# Patient Record
Sex: Female | Born: 1940
Health system: Southern US, Community
[De-identification: ages and names within clinical notes are randomized; demographics above are authoritative.]

## PROBLEM LIST (undated history)

## (undated) DIAGNOSIS — I10 Essential (primary) hypertension: Secondary | ICD-10-CM

## (undated) DIAGNOSIS — M810 Age-related osteoporosis without current pathological fracture: Secondary | ICD-10-CM

## (undated) DIAGNOSIS — M1990 Unspecified osteoarthritis, unspecified site: Secondary | ICD-10-CM

## (undated) DIAGNOSIS — E039 Hypothyroidism, unspecified: Secondary | ICD-10-CM

## (undated) HISTORY — DX: Unspecified osteoarthritis, unspecified site: M19.90

## (undated) HISTORY — PX: BLADDER SURGERY: SHX569

## (undated) HISTORY — DX: Age-related osteoporosis without current pathological fracture: M81.0

## (undated) HISTORY — DX: Essential (primary) hypertension: I10

## (undated) HISTORY — DX: Hypothyroidism, unspecified: E03.9

## (undated) HISTORY — PX: OTHER SURGICAL HISTORY: SHX169

## (undated) HISTORY — PX: COLON SURGERY: SHX602

---

## 1999-11-11 ENCOUNTER — Encounter: Payer: Self-pay | Admitting: Internal Medicine

## 1999-11-11 ENCOUNTER — Encounter: Admission: RE | Admit: 1999-11-11 | Discharge: 1999-11-11 | Payer: Self-pay | Admitting: Internal Medicine

## 2000-12-24 ENCOUNTER — Encounter: Payer: Self-pay | Admitting: Internal Medicine

## 2000-12-24 ENCOUNTER — Encounter: Admission: RE | Admit: 2000-12-24 | Discharge: 2000-12-24 | Payer: Self-pay | Admitting: Internal Medicine

## 2002-06-16 ENCOUNTER — Encounter: Payer: Self-pay | Admitting: Internal Medicine

## 2002-06-16 ENCOUNTER — Encounter: Admission: RE | Admit: 2002-06-16 | Discharge: 2002-06-16 | Payer: Self-pay | Admitting: Internal Medicine

## 2005-06-13 ENCOUNTER — Encounter: Admission: RE | Admit: 2005-06-13 | Discharge: 2005-06-13 | Payer: Self-pay | Admitting: Internal Medicine

## 2006-06-20 ENCOUNTER — Encounter: Admission: RE | Admit: 2006-06-20 | Discharge: 2006-06-20 | Payer: Self-pay | Admitting: Internal Medicine

## 2013-04-08 DIAGNOSIS — N8111 Cystocele, midline: Secondary | ICD-10-CM | POA: Diagnosis not present

## 2013-04-08 DIAGNOSIS — N814 Uterovaginal prolapse, unspecified: Secondary | ICD-10-CM | POA: Diagnosis not present

## 2013-04-30 DIAGNOSIS — N8189 Other female genital prolapse: Secondary | ICD-10-CM | POA: Diagnosis not present

## 2013-04-30 DIAGNOSIS — N814 Uterovaginal prolapse, unspecified: Secondary | ICD-10-CM | POA: Diagnosis not present

## 2013-04-30 DIAGNOSIS — I10 Essential (primary) hypertension: Secondary | ICD-10-CM | POA: Diagnosis not present

## 2013-04-30 DIAGNOSIS — D259 Leiomyoma of uterus, unspecified: Secondary | ICD-10-CM | POA: Diagnosis not present

## 2013-04-30 DIAGNOSIS — N8111 Cystocele, midline: Secondary | ICD-10-CM | POA: Diagnosis not present

## 2013-04-30 DIAGNOSIS — E039 Hypothyroidism, unspecified: Secondary | ICD-10-CM | POA: Diagnosis not present

## 2013-04-30 DIAGNOSIS — Z79899 Other long term (current) drug therapy: Secondary | ICD-10-CM | POA: Diagnosis not present

## 2013-05-01 DIAGNOSIS — Z79899 Other long term (current) drug therapy: Secondary | ICD-10-CM | POA: Diagnosis not present

## 2013-05-01 DIAGNOSIS — N8189 Other female genital prolapse: Secondary | ICD-10-CM | POA: Diagnosis not present

## 2013-05-01 DIAGNOSIS — D259 Leiomyoma of uterus, unspecified: Secondary | ICD-10-CM | POA: Diagnosis not present

## 2013-05-01 DIAGNOSIS — E039 Hypothyroidism, unspecified: Secondary | ICD-10-CM | POA: Diagnosis not present

## 2013-05-01 DIAGNOSIS — I10 Essential (primary) hypertension: Secondary | ICD-10-CM | POA: Diagnosis not present

## 2013-07-24 DIAGNOSIS — IMO0002 Reserved for concepts with insufficient information to code with codable children: Secondary | ICD-10-CM | POA: Diagnosis not present

## 2013-07-24 DIAGNOSIS — J019 Acute sinusitis, unspecified: Secondary | ICD-10-CM | POA: Diagnosis not present

## 2013-07-24 DIAGNOSIS — J309 Allergic rhinitis, unspecified: Secondary | ICD-10-CM | POA: Diagnosis not present

## 2013-10-22 DIAGNOSIS — E039 Hypothyroidism, unspecified: Secondary | ICD-10-CM | POA: Diagnosis not present

## 2013-10-22 DIAGNOSIS — I10 Essential (primary) hypertension: Secondary | ICD-10-CM | POA: Diagnosis not present

## 2013-10-22 DIAGNOSIS — IMO0002 Reserved for concepts with insufficient information to code with codable children: Secondary | ICD-10-CM | POA: Diagnosis not present

## 2013-10-22 DIAGNOSIS — M810 Age-related osteoporosis without current pathological fracture: Secondary | ICD-10-CM | POA: Diagnosis not present

## 2013-10-22 DIAGNOSIS — E559 Vitamin D deficiency, unspecified: Secondary | ICD-10-CM | POA: Diagnosis not present

## 2013-10-28 DIAGNOSIS — M810 Age-related osteoporosis without current pathological fracture: Secondary | ICD-10-CM | POA: Diagnosis not present

## 2014-01-13 DIAGNOSIS — N390 Urinary tract infection, site not specified: Secondary | ICD-10-CM | POA: Diagnosis not present

## 2014-01-13 DIAGNOSIS — I10 Essential (primary) hypertension: Secondary | ICD-10-CM | POA: Diagnosis not present

## 2014-04-28 DIAGNOSIS — M810 Age-related osteoporosis without current pathological fracture: Secondary | ICD-10-CM | POA: Diagnosis not present

## 2014-04-28 DIAGNOSIS — Z9181 History of falling: Secondary | ICD-10-CM | POA: Diagnosis not present

## 2014-04-28 DIAGNOSIS — Z1389 Encounter for screening for other disorder: Secondary | ICD-10-CM | POA: Diagnosis not present

## 2014-04-28 DIAGNOSIS — I10 Essential (primary) hypertension: Secondary | ICD-10-CM | POA: Diagnosis not present

## 2014-04-28 DIAGNOSIS — E785 Hyperlipidemia, unspecified: Secondary | ICD-10-CM | POA: Diagnosis not present

## 2014-04-28 DIAGNOSIS — Z23 Encounter for immunization: Secondary | ICD-10-CM | POA: Diagnosis not present

## 2014-04-28 DIAGNOSIS — E559 Vitamin D deficiency, unspecified: Secondary | ICD-10-CM | POA: Diagnosis not present

## 2014-04-28 DIAGNOSIS — Z1231 Encounter for screening mammogram for malignant neoplasm of breast: Secondary | ICD-10-CM | POA: Diagnosis not present

## 2014-04-28 DIAGNOSIS — M359 Systemic involvement of connective tissue, unspecified: Secondary | ICD-10-CM | POA: Diagnosis not present

## 2014-04-28 DIAGNOSIS — Z0000 Encounter for general adult medical examination without abnormal findings: Secondary | ICD-10-CM | POA: Diagnosis not present

## 2014-04-28 DIAGNOSIS — E039 Hypothyroidism, unspecified: Secondary | ICD-10-CM | POA: Diagnosis not present

## 2014-05-05 DIAGNOSIS — M810 Age-related osteoporosis without current pathological fracture: Secondary | ICD-10-CM | POA: Diagnosis not present

## 2014-05-05 DIAGNOSIS — Z1231 Encounter for screening mammogram for malignant neoplasm of breast: Secondary | ICD-10-CM | POA: Diagnosis not present

## 2014-05-19 DIAGNOSIS — M359 Systemic involvement of connective tissue, unspecified: Secondary | ICD-10-CM | POA: Diagnosis not present

## 2014-05-19 DIAGNOSIS — H524 Presbyopia: Secondary | ICD-10-CM | POA: Diagnosis not present

## 2014-05-19 DIAGNOSIS — Z79899 Other long term (current) drug therapy: Secondary | ICD-10-CM | POA: Diagnosis not present

## 2014-09-04 DIAGNOSIS — K209 Esophagitis, unspecified: Secondary | ICD-10-CM | POA: Diagnosis not present

## 2014-09-24 DIAGNOSIS — H16002 Unspecified corneal ulcer, left eye: Secondary | ICD-10-CM | POA: Diagnosis not present

## 2014-09-24 DIAGNOSIS — H169 Unspecified keratitis: Secondary | ICD-10-CM | POA: Diagnosis not present

## 2014-09-24 DIAGNOSIS — R918 Other nonspecific abnormal finding of lung field: Secondary | ICD-10-CM | POA: Diagnosis not present

## 2014-09-25 DIAGNOSIS — H16002 Unspecified corneal ulcer, left eye: Secondary | ICD-10-CM | POA: Diagnosis not present

## 2014-09-28 DIAGNOSIS — H16002 Unspecified corneal ulcer, left eye: Secondary | ICD-10-CM | POA: Diagnosis not present

## 2014-10-02 DIAGNOSIS — H16002 Unspecified corneal ulcer, left eye: Secondary | ICD-10-CM | POA: Diagnosis not present

## 2014-10-09 DIAGNOSIS — H16002 Unspecified corneal ulcer, left eye: Secondary | ICD-10-CM | POA: Diagnosis not present

## 2014-10-16 DIAGNOSIS — H16002 Unspecified corneal ulcer, left eye: Secondary | ICD-10-CM | POA: Diagnosis not present

## 2014-10-28 DIAGNOSIS — M810 Age-related osteoporosis without current pathological fracture: Secondary | ICD-10-CM | POA: Diagnosis not present

## 2014-10-28 DIAGNOSIS — M359 Systemic involvement of connective tissue, unspecified: Secondary | ICD-10-CM | POA: Diagnosis not present

## 2014-10-28 DIAGNOSIS — I10 Essential (primary) hypertension: Secondary | ICD-10-CM | POA: Diagnosis not present

## 2014-10-28 DIAGNOSIS — E785 Hyperlipidemia, unspecified: Secondary | ICD-10-CM | POA: Diagnosis not present

## 2014-10-28 DIAGNOSIS — Z1389 Encounter for screening for other disorder: Secondary | ICD-10-CM | POA: Diagnosis not present

## 2014-10-28 DIAGNOSIS — Z9181 History of falling: Secondary | ICD-10-CM | POA: Diagnosis not present

## 2014-11-04 DIAGNOSIS — M810 Age-related osteoporosis without current pathological fracture: Secondary | ICD-10-CM | POA: Diagnosis not present

## 2014-11-13 DIAGNOSIS — H16002 Unspecified corneal ulcer, left eye: Secondary | ICD-10-CM | POA: Diagnosis not present

## 2014-11-18 DIAGNOSIS — H179 Unspecified corneal scar and opacity: Secondary | ICD-10-CM | POA: Diagnosis not present

## 2014-11-18 DIAGNOSIS — H524 Presbyopia: Secondary | ICD-10-CM | POA: Diagnosis not present

## 2014-12-05 DIAGNOSIS — Z23 Encounter for immunization: Secondary | ICD-10-CM | POA: Diagnosis not present

## 2014-12-11 DIAGNOSIS — N3001 Acute cystitis with hematuria: Secondary | ICD-10-CM | POA: Diagnosis not present

## 2014-12-11 DIAGNOSIS — N3090 Cystitis, unspecified without hematuria: Secondary | ICD-10-CM | POA: Diagnosis not present

## 2015-02-20 DIAGNOSIS — J0100 Acute maxillary sinusitis, unspecified: Secondary | ICD-10-CM | POA: Diagnosis not present

## 2015-05-05 DIAGNOSIS — I10 Essential (primary) hypertension: Secondary | ICD-10-CM | POA: Diagnosis not present

## 2015-05-05 DIAGNOSIS — N390 Urinary tract infection, site not specified: Secondary | ICD-10-CM | POA: Diagnosis not present

## 2015-05-05 DIAGNOSIS — E785 Hyperlipidemia, unspecified: Secondary | ICD-10-CM | POA: Diagnosis not present

## 2015-05-05 DIAGNOSIS — E039 Hypothyroidism, unspecified: Secondary | ICD-10-CM | POA: Diagnosis not present

## 2015-05-05 DIAGNOSIS — E559 Vitamin D deficiency, unspecified: Secondary | ICD-10-CM | POA: Diagnosis not present

## 2015-05-05 DIAGNOSIS — M359 Systemic involvement of connective tissue, unspecified: Secondary | ICD-10-CM | POA: Diagnosis not present

## 2015-05-05 DIAGNOSIS — R300 Dysuria: Secondary | ICD-10-CM | POA: Diagnosis not present

## 2015-05-05 DIAGNOSIS — Z0000 Encounter for general adult medical examination without abnormal findings: Secondary | ICD-10-CM | POA: Diagnosis not present

## 2015-05-05 DIAGNOSIS — Z1231 Encounter for screening mammogram for malignant neoplasm of breast: Secondary | ICD-10-CM | POA: Diagnosis not present

## 2015-05-05 DIAGNOSIS — M810 Age-related osteoporosis without current pathological fracture: Secondary | ICD-10-CM | POA: Diagnosis not present

## 2015-05-19 DIAGNOSIS — M810 Age-related osteoporosis without current pathological fracture: Secondary | ICD-10-CM | POA: Diagnosis not present

## 2015-05-19 DIAGNOSIS — Z1231 Encounter for screening mammogram for malignant neoplasm of breast: Secondary | ICD-10-CM | POA: Diagnosis not present

## 2015-05-19 DIAGNOSIS — M8589 Other specified disorders of bone density and structure, multiple sites: Secondary | ICD-10-CM | POA: Diagnosis not present

## 2015-05-25 DIAGNOSIS — M810 Age-related osteoporosis without current pathological fracture: Secondary | ICD-10-CM | POA: Diagnosis not present

## 2015-06-04 DIAGNOSIS — H179 Unspecified corneal scar and opacity: Secondary | ICD-10-CM | POA: Diagnosis not present

## 2015-06-23 DIAGNOSIS — H02831 Dermatochalasis of right upper eyelid: Secondary | ICD-10-CM | POA: Diagnosis not present

## 2015-06-23 DIAGNOSIS — H02834 Dermatochalasis of left upper eyelid: Secondary | ICD-10-CM | POA: Diagnosis not present

## 2015-07-28 DIAGNOSIS — R601 Generalized edema: Secondary | ICD-10-CM | POA: Diagnosis not present

## 2015-07-28 DIAGNOSIS — M359 Systemic involvement of connective tissue, unspecified: Secondary | ICD-10-CM | POA: Diagnosis not present

## 2015-07-28 DIAGNOSIS — Z6826 Body mass index (BMI) 26.0-26.9, adult: Secondary | ICD-10-CM | POA: Diagnosis not present

## 2015-09-07 DIAGNOSIS — Z6826 Body mass index (BMI) 26.0-26.9, adult: Secondary | ICD-10-CM | POA: Diagnosis not present

## 2015-09-07 DIAGNOSIS — M359 Systemic involvement of connective tissue, unspecified: Secondary | ICD-10-CM | POA: Diagnosis not present

## 2015-09-07 DIAGNOSIS — E785 Hyperlipidemia, unspecified: Secondary | ICD-10-CM | POA: Diagnosis not present

## 2015-09-07 DIAGNOSIS — I10 Essential (primary) hypertension: Secondary | ICD-10-CM | POA: Diagnosis not present

## 2015-09-07 DIAGNOSIS — E039 Hypothyroidism, unspecified: Secondary | ICD-10-CM | POA: Diagnosis not present

## 2015-10-19 DIAGNOSIS — R609 Edema, unspecified: Secondary | ICD-10-CM | POA: Diagnosis not present

## 2015-10-19 DIAGNOSIS — I509 Heart failure, unspecified: Secondary | ICD-10-CM | POA: Diagnosis not present

## 2015-11-17 DIAGNOSIS — M810 Age-related osteoporosis without current pathological fracture: Secondary | ICD-10-CM | POA: Diagnosis not present

## 2015-12-08 DIAGNOSIS — M810 Age-related osteoporosis without current pathological fracture: Secondary | ICD-10-CM | POA: Diagnosis not present

## 2016-01-11 DIAGNOSIS — E785 Hyperlipidemia, unspecified: Secondary | ICD-10-CM | POA: Diagnosis not present

## 2016-01-11 DIAGNOSIS — M810 Age-related osteoporosis without current pathological fracture: Secondary | ICD-10-CM | POA: Diagnosis not present

## 2016-01-11 DIAGNOSIS — R601 Generalized edema: Secondary | ICD-10-CM | POA: Diagnosis not present

## 2016-01-11 DIAGNOSIS — Z9181 History of falling: Secondary | ICD-10-CM | POA: Diagnosis not present

## 2016-01-11 DIAGNOSIS — M359 Systemic involvement of connective tissue, unspecified: Secondary | ICD-10-CM | POA: Diagnosis not present

## 2016-01-11 DIAGNOSIS — I10 Essential (primary) hypertension: Secondary | ICD-10-CM | POA: Diagnosis not present

## 2016-01-11 DIAGNOSIS — Z1389 Encounter for screening for other disorder: Secondary | ICD-10-CM | POA: Diagnosis not present

## 2016-01-11 DIAGNOSIS — E559 Vitamin D deficiency, unspecified: Secondary | ICD-10-CM | POA: Diagnosis not present

## 2016-01-11 DIAGNOSIS — E039 Hypothyroidism, unspecified: Secondary | ICD-10-CM | POA: Diagnosis not present

## 2016-01-17 DIAGNOSIS — R300 Dysuria: Secondary | ICD-10-CM | POA: Diagnosis not present

## 2016-01-24 DIAGNOSIS — I2119 ST elevation (STEMI) myocardial infarction involving other coronary artery of inferior wall: Secondary | ICD-10-CM | POA: Diagnosis not present

## 2016-01-24 DIAGNOSIS — E039 Hypothyroidism, unspecified: Secondary | ICD-10-CM | POA: Diagnosis not present

## 2016-01-24 DIAGNOSIS — I10 Essential (primary) hypertension: Secondary | ICD-10-CM | POA: Diagnosis not present

## 2016-01-24 DIAGNOSIS — I2510 Atherosclerotic heart disease of native coronary artery without angina pectoris: Secondary | ICD-10-CM | POA: Diagnosis not present

## 2016-01-24 DIAGNOSIS — R079 Chest pain, unspecified: Secondary | ICD-10-CM | POA: Diagnosis not present

## 2016-01-24 DIAGNOSIS — Z01810 Encounter for preprocedural cardiovascular examination: Secondary | ICD-10-CM | POA: Diagnosis not present

## 2016-01-24 DIAGNOSIS — I25118 Atherosclerotic heart disease of native coronary artery with other forms of angina pectoris: Secondary | ICD-10-CM | POA: Diagnosis not present

## 2016-01-24 DIAGNOSIS — I6523 Occlusion and stenosis of bilateral carotid arteries: Secondary | ICD-10-CM | POA: Diagnosis not present

## 2016-01-24 DIAGNOSIS — I2111 ST elevation (STEMI) myocardial infarction involving right coronary artery: Secondary | ICD-10-CM | POA: Diagnosis not present

## 2016-01-25 DIAGNOSIS — I5021 Acute systolic (congestive) heart failure: Secondary | ICD-10-CM | POA: Diagnosis not present

## 2016-01-25 DIAGNOSIS — I2510 Atherosclerotic heart disease of native coronary artery without angina pectoris: Secondary | ICD-10-CM | POA: Diagnosis not present

## 2016-01-25 DIAGNOSIS — I2119 ST elevation (STEMI) myocardial infarction involving other coronary artery of inferior wall: Secondary | ICD-10-CM | POA: Diagnosis not present

## 2016-01-25 DIAGNOSIS — I2111 ST elevation (STEMI) myocardial infarction involving right coronary artery: Secondary | ICD-10-CM | POA: Diagnosis not present

## 2016-01-25 DIAGNOSIS — I25118 Atherosclerotic heart disease of native coronary artery with other forms of angina pectoris: Secondary | ICD-10-CM | POA: Diagnosis not present

## 2016-01-25 DIAGNOSIS — I213 ST elevation (STEMI) myocardial infarction of unspecified site: Secondary | ICD-10-CM | POA: Diagnosis not present

## 2016-01-26 DIAGNOSIS — I2111 ST elevation (STEMI) myocardial infarction involving right coronary artery: Secondary | ICD-10-CM | POA: Diagnosis not present

## 2016-01-26 DIAGNOSIS — I5021 Acute systolic (congestive) heart failure: Secondary | ICD-10-CM | POA: Diagnosis not present

## 2016-01-26 DIAGNOSIS — I2119 ST elevation (STEMI) myocardial infarction involving other coronary artery of inferior wall: Secondary | ICD-10-CM | POA: Diagnosis not present

## 2016-01-26 DIAGNOSIS — I25118 Atherosclerotic heart disease of native coronary artery with other forms of angina pectoris: Secondary | ICD-10-CM | POA: Diagnosis not present

## 2016-01-26 DIAGNOSIS — I252 Old myocardial infarction: Secondary | ICD-10-CM | POA: Diagnosis not present

## 2016-01-26 DIAGNOSIS — R9431 Abnormal electrocardiogram [ECG] [EKG]: Secondary | ICD-10-CM | POA: Diagnosis not present

## 2016-01-27 DIAGNOSIS — I2102 ST elevation (STEMI) myocardial infarction involving left anterior descending coronary artery: Secondary | ICD-10-CM | POA: Diagnosis not present

## 2016-01-27 DIAGNOSIS — Z955 Presence of coronary angioplasty implant and graft: Secondary | ICD-10-CM | POA: Diagnosis not present

## 2016-01-27 DIAGNOSIS — R9431 Abnormal electrocardiogram [ECG] [EKG]: Secondary | ICD-10-CM | POA: Diagnosis not present

## 2016-01-27 DIAGNOSIS — I25118 Atherosclerotic heart disease of native coronary artery with other forms of angina pectoris: Secondary | ICD-10-CM | POA: Diagnosis not present

## 2016-01-27 DIAGNOSIS — I119 Hypertensive heart disease without heart failure: Secondary | ICD-10-CM | POA: Diagnosis not present

## 2016-01-27 DIAGNOSIS — R001 Bradycardia, unspecified: Secondary | ICD-10-CM | POA: Diagnosis not present

## 2016-01-28 DIAGNOSIS — I25118 Atherosclerotic heart disease of native coronary artery with other forms of angina pectoris: Secondary | ICD-10-CM | POA: Diagnosis not present

## 2016-01-28 DIAGNOSIS — E039 Hypothyroidism, unspecified: Secondary | ICD-10-CM | POA: Diagnosis not present

## 2016-01-28 DIAGNOSIS — I119 Hypertensive heart disease without heart failure: Secondary | ICD-10-CM | POA: Diagnosis not present

## 2016-01-28 DIAGNOSIS — Z955 Presence of coronary angioplasty implant and graft: Secondary | ICD-10-CM | POA: Diagnosis not present

## 2016-01-28 DIAGNOSIS — I2102 ST elevation (STEMI) myocardial infarction involving left anterior descending coronary artery: Secondary | ICD-10-CM | POA: Diagnosis not present

## 2016-01-28 DIAGNOSIS — I252 Old myocardial infarction: Secondary | ICD-10-CM | POA: Diagnosis not present

## 2016-01-28 DIAGNOSIS — R9431 Abnormal electrocardiogram [ECG] [EKG]: Secondary | ICD-10-CM | POA: Diagnosis not present

## 2016-02-07 DIAGNOSIS — H02834 Dermatochalasis of left upper eyelid: Secondary | ICD-10-CM | POA: Diagnosis not present

## 2016-02-07 DIAGNOSIS — H02831 Dermatochalasis of right upper eyelid: Secondary | ICD-10-CM | POA: Diagnosis not present

## 2016-02-14 DIAGNOSIS — I5021 Acute systolic (congestive) heart failure: Secondary | ICD-10-CM | POA: Diagnosis not present

## 2016-02-14 DIAGNOSIS — I25119 Atherosclerotic heart disease of native coronary artery with unspecified angina pectoris: Secondary | ICD-10-CM | POA: Diagnosis not present

## 2016-02-14 DIAGNOSIS — I2119 ST elevation (STEMI) myocardial infarction involving other coronary artery of inferior wall: Secondary | ICD-10-CM | POA: Diagnosis not present

## 2016-02-29 DIAGNOSIS — Z955 Presence of coronary angioplasty implant and graft: Secondary | ICD-10-CM | POA: Diagnosis not present

## 2016-02-29 DIAGNOSIS — I252 Old myocardial infarction: Secondary | ICD-10-CM | POA: Diagnosis not present

## 2016-02-29 DIAGNOSIS — I5021 Acute systolic (congestive) heart failure: Secondary | ICD-10-CM | POA: Diagnosis not present

## 2016-03-03 DIAGNOSIS — Z955 Presence of coronary angioplasty implant and graft: Secondary | ICD-10-CM | POA: Diagnosis not present

## 2016-03-03 DIAGNOSIS — I252 Old myocardial infarction: Secondary | ICD-10-CM | POA: Diagnosis not present

## 2016-03-03 DIAGNOSIS — I5021 Acute systolic (congestive) heart failure: Secondary | ICD-10-CM | POA: Diagnosis not present

## 2016-03-07 DIAGNOSIS — I10 Essential (primary) hypertension: Secondary | ICD-10-CM | POA: Diagnosis not present

## 2016-03-07 DIAGNOSIS — I213 ST elevation (STEMI) myocardial infarction of unspecified site: Secondary | ICD-10-CM | POA: Diagnosis not present

## 2016-03-07 DIAGNOSIS — J309 Allergic rhinitis, unspecified: Secondary | ICD-10-CM | POA: Diagnosis not present

## 2016-03-21 DIAGNOSIS — J208 Acute bronchitis due to other specified organisms: Secondary | ICD-10-CM | POA: Diagnosis not present

## 2016-03-21 DIAGNOSIS — R233 Spontaneous ecchymoses: Secondary | ICD-10-CM | POA: Diagnosis not present

## 2016-03-21 DIAGNOSIS — R300 Dysuria: Secondary | ICD-10-CM | POA: Diagnosis not present

## 2016-03-21 DIAGNOSIS — N390 Urinary tract infection, site not specified: Secondary | ICD-10-CM | POA: Diagnosis not present

## 2016-03-29 DIAGNOSIS — R05 Cough: Secondary | ICD-10-CM | POA: Diagnosis not present

## 2016-03-29 DIAGNOSIS — R0602 Shortness of breath: Secondary | ICD-10-CM | POA: Diagnosis not present

## 2016-03-29 DIAGNOSIS — J208 Acute bronchitis due to other specified organisms: Secondary | ICD-10-CM | POA: Diagnosis not present

## 2016-03-29 DIAGNOSIS — R0600 Dyspnea, unspecified: Secondary | ICD-10-CM | POA: Diagnosis not present

## 2016-04-10 DIAGNOSIS — Z955 Presence of coronary angioplasty implant and graft: Secondary | ICD-10-CM | POA: Diagnosis not present

## 2016-04-10 DIAGNOSIS — I252 Old myocardial infarction: Secondary | ICD-10-CM | POA: Diagnosis not present

## 2016-04-18 DIAGNOSIS — H524 Presbyopia: Secondary | ICD-10-CM | POA: Diagnosis not present

## 2016-05-05 DIAGNOSIS — Z955 Presence of coronary angioplasty implant and graft: Secondary | ICD-10-CM | POA: Diagnosis not present

## 2016-05-05 DIAGNOSIS — I252 Old myocardial infarction: Secondary | ICD-10-CM | POA: Diagnosis not present

## 2016-05-05 DIAGNOSIS — I5021 Acute systolic (congestive) heart failure: Secondary | ICD-10-CM | POA: Diagnosis not present

## 2016-05-23 DIAGNOSIS — M359 Systemic involvement of connective tissue, unspecified: Secondary | ICD-10-CM | POA: Diagnosis not present

## 2016-05-23 DIAGNOSIS — I10 Essential (primary) hypertension: Secondary | ICD-10-CM | POA: Diagnosis not present

## 2016-05-23 DIAGNOSIS — Z6824 Body mass index (BMI) 24.0-24.9, adult: Secondary | ICD-10-CM | POA: Diagnosis not present

## 2016-05-23 DIAGNOSIS — I213 ST elevation (STEMI) myocardial infarction of unspecified site: Secondary | ICD-10-CM | POA: Diagnosis not present

## 2016-05-23 DIAGNOSIS — R233 Spontaneous ecchymoses: Secondary | ICD-10-CM | POA: Diagnosis not present

## 2016-05-23 DIAGNOSIS — E785 Hyperlipidemia, unspecified: Secondary | ICD-10-CM | POA: Diagnosis not present

## 2016-06-05 DIAGNOSIS — Z955 Presence of coronary angioplasty implant and graft: Secondary | ICD-10-CM | POA: Diagnosis not present

## 2016-06-05 DIAGNOSIS — I252 Old myocardial infarction: Secondary | ICD-10-CM | POA: Diagnosis not present

## 2016-06-05 DIAGNOSIS — I5021 Acute systolic (congestive) heart failure: Secondary | ICD-10-CM | POA: Diagnosis not present

## 2016-06-19 DIAGNOSIS — M810 Age-related osteoporosis without current pathological fracture: Secondary | ICD-10-CM | POA: Diagnosis not present

## 2016-08-08 DIAGNOSIS — Z1389 Encounter for screening for other disorder: Secondary | ICD-10-CM | POA: Diagnosis not present

## 2016-08-08 DIAGNOSIS — Z0000 Encounter for general adult medical examination without abnormal findings: Secondary | ICD-10-CM | POA: Diagnosis not present

## 2016-08-08 DIAGNOSIS — E785 Hyperlipidemia, unspecified: Secondary | ICD-10-CM | POA: Diagnosis not present

## 2016-08-08 DIAGNOSIS — Z136 Encounter for screening for cardiovascular disorders: Secondary | ICD-10-CM | POA: Diagnosis not present

## 2016-08-08 DIAGNOSIS — Z1211 Encounter for screening for malignant neoplasm of colon: Secondary | ICD-10-CM | POA: Diagnosis not present

## 2016-08-08 DIAGNOSIS — Z9181 History of falling: Secondary | ICD-10-CM | POA: Diagnosis not present

## 2016-08-14 DIAGNOSIS — I252 Old myocardial infarction: Secondary | ICD-10-CM | POA: Diagnosis not present

## 2016-08-14 DIAGNOSIS — I213 ST elevation (STEMI) myocardial infarction of unspecified site: Secondary | ICD-10-CM | POA: Diagnosis not present

## 2016-08-14 DIAGNOSIS — I25119 Atherosclerotic heart disease of native coronary artery with unspecified angina pectoris: Secondary | ICD-10-CM | POA: Diagnosis not present

## 2016-08-14 DIAGNOSIS — I5021 Acute systolic (congestive) heart failure: Secondary | ICD-10-CM | POA: Diagnosis not present

## 2016-08-24 DIAGNOSIS — N390 Urinary tract infection, site not specified: Secondary | ICD-10-CM | POA: Diagnosis not present

## 2016-09-19 DIAGNOSIS — E785 Hyperlipidemia, unspecified: Secondary | ICD-10-CM | POA: Diagnosis not present

## 2016-09-19 DIAGNOSIS — Z139 Encounter for screening, unspecified: Secondary | ICD-10-CM | POA: Diagnosis not present

## 2016-09-19 DIAGNOSIS — N3020 Other chronic cystitis without hematuria: Secondary | ICD-10-CM | POA: Diagnosis not present

## 2016-09-19 DIAGNOSIS — M359 Systemic involvement of connective tissue, unspecified: Secondary | ICD-10-CM | POA: Diagnosis not present

## 2016-09-19 DIAGNOSIS — I10 Essential (primary) hypertension: Secondary | ICD-10-CM | POA: Diagnosis not present

## 2016-09-19 DIAGNOSIS — E039 Hypothyroidism, unspecified: Secondary | ICD-10-CM | POA: Diagnosis not present

## 2016-09-19 DIAGNOSIS — Z6826 Body mass index (BMI) 26.0-26.9, adult: Secondary | ICD-10-CM | POA: Diagnosis not present

## 2016-09-20 DIAGNOSIS — I25119 Atherosclerotic heart disease of native coronary artery with unspecified angina pectoris: Secondary | ICD-10-CM | POA: Diagnosis not present

## 2016-09-20 DIAGNOSIS — I5021 Acute systolic (congestive) heart failure: Secondary | ICD-10-CM | POA: Diagnosis not present

## 2016-11-30 DIAGNOSIS — Z6827 Body mass index (BMI) 27.0-27.9, adult: Secondary | ICD-10-CM | POA: Diagnosis not present

## 2016-11-30 DIAGNOSIS — N3000 Acute cystitis without hematuria: Secondary | ICD-10-CM | POA: Diagnosis not present

## 2017-01-23 DIAGNOSIS — Z139 Encounter for screening, unspecified: Secondary | ICD-10-CM | POA: Diagnosis not present

## 2017-01-23 DIAGNOSIS — N3020 Other chronic cystitis without hematuria: Secondary | ICD-10-CM | POA: Diagnosis not present

## 2017-01-23 DIAGNOSIS — E785 Hyperlipidemia, unspecified: Secondary | ICD-10-CM | POA: Diagnosis not present

## 2017-01-23 DIAGNOSIS — Z1389 Encounter for screening for other disorder: Secondary | ICD-10-CM | POA: Diagnosis not present

## 2017-01-23 DIAGNOSIS — E039 Hypothyroidism, unspecified: Secondary | ICD-10-CM | POA: Diagnosis not present

## 2017-01-23 DIAGNOSIS — M810 Age-related osteoporosis without current pathological fracture: Secondary | ICD-10-CM | POA: Diagnosis not present

## 2017-01-23 DIAGNOSIS — I10 Essential (primary) hypertension: Secondary | ICD-10-CM | POA: Diagnosis not present

## 2017-02-01 DIAGNOSIS — M810 Age-related osteoporosis without current pathological fracture: Secondary | ICD-10-CM | POA: Diagnosis not present

## 2017-05-28 DIAGNOSIS — M810 Age-related osteoporosis without current pathological fracture: Secondary | ICD-10-CM | POA: Diagnosis not present

## 2017-05-28 DIAGNOSIS — I10 Essential (primary) hypertension: Secondary | ICD-10-CM | POA: Diagnosis not present

## 2017-05-28 DIAGNOSIS — E039 Hypothyroidism, unspecified: Secondary | ICD-10-CM | POA: Diagnosis not present

## 2017-05-28 DIAGNOSIS — E559 Vitamin D deficiency, unspecified: Secondary | ICD-10-CM | POA: Diagnosis not present

## 2017-05-28 DIAGNOSIS — I2510 Atherosclerotic heart disease of native coronary artery without angina pectoris: Secondary | ICD-10-CM | POA: Diagnosis not present

## 2017-05-28 DIAGNOSIS — E785 Hyperlipidemia, unspecified: Secondary | ICD-10-CM | POA: Diagnosis not present

## 2017-05-28 DIAGNOSIS — L659 Nonscarring hair loss, unspecified: Secondary | ICD-10-CM | POA: Diagnosis not present

## 2017-05-28 DIAGNOSIS — M359 Systemic involvement of connective tissue, unspecified: Secondary | ICD-10-CM | POA: Diagnosis not present

## 2017-05-28 DIAGNOSIS — Z6827 Body mass index (BMI) 27.0-27.9, adult: Secondary | ICD-10-CM | POA: Diagnosis not present

## 2017-06-26 DIAGNOSIS — H25813 Combined forms of age-related cataract, bilateral: Secondary | ICD-10-CM | POA: Diagnosis not present

## 2017-06-26 DIAGNOSIS — H02831 Dermatochalasis of right upper eyelid: Secondary | ICD-10-CM | POA: Diagnosis not present

## 2017-06-26 DIAGNOSIS — H02834 Dermatochalasis of left upper eyelid: Secondary | ICD-10-CM | POA: Diagnosis not present

## 2017-07-03 DIAGNOSIS — H02834 Dermatochalasis of left upper eyelid: Secondary | ICD-10-CM | POA: Diagnosis not present

## 2017-07-03 DIAGNOSIS — H02831 Dermatochalasis of right upper eyelid: Secondary | ICD-10-CM | POA: Diagnosis not present

## 2017-07-23 DIAGNOSIS — H02831 Dermatochalasis of right upper eyelid: Secondary | ICD-10-CM | POA: Diagnosis not present

## 2017-07-23 DIAGNOSIS — Z01818 Encounter for other preprocedural examination: Secondary | ICD-10-CM | POA: Diagnosis not present

## 2017-07-23 DIAGNOSIS — H02834 Dermatochalasis of left upper eyelid: Secondary | ICD-10-CM | POA: Diagnosis not present

## 2017-07-26 DIAGNOSIS — M359 Systemic involvement of connective tissue, unspecified: Secondary | ICD-10-CM | POA: Diagnosis not present

## 2017-07-26 DIAGNOSIS — Z6828 Body mass index (BMI) 28.0-28.9, adult: Secondary | ICD-10-CM | POA: Diagnosis not present

## 2017-07-26 DIAGNOSIS — L659 Nonscarring hair loss, unspecified: Secondary | ICD-10-CM | POA: Diagnosis not present

## 2017-07-26 DIAGNOSIS — E039 Hypothyroidism, unspecified: Secondary | ICD-10-CM | POA: Diagnosis not present

## 2017-07-26 DIAGNOSIS — N3020 Other chronic cystitis without hematuria: Secondary | ICD-10-CM | POA: Diagnosis not present

## 2017-07-26 DIAGNOSIS — R601 Generalized edema: Secondary | ICD-10-CM | POA: Diagnosis not present

## 2017-08-08 DIAGNOSIS — H02831 Dermatochalasis of right upper eyelid: Secondary | ICD-10-CM | POA: Diagnosis not present

## 2017-08-08 DIAGNOSIS — H02413 Mechanical ptosis of bilateral eyelids: Secondary | ICD-10-CM | POA: Diagnosis not present

## 2017-08-08 DIAGNOSIS — H02834 Dermatochalasis of left upper eyelid: Secondary | ICD-10-CM | POA: Diagnosis not present

## 2017-08-13 DIAGNOSIS — Z0000 Encounter for general adult medical examination without abnormal findings: Secondary | ICD-10-CM | POA: Diagnosis not present

## 2017-08-13 DIAGNOSIS — Z1231 Encounter for screening mammogram for malignant neoplasm of breast: Secondary | ICD-10-CM | POA: Diagnosis not present

## 2017-08-13 DIAGNOSIS — Z9181 History of falling: Secondary | ICD-10-CM | POA: Diagnosis not present

## 2017-08-13 DIAGNOSIS — N959 Unspecified menopausal and perimenopausal disorder: Secondary | ICD-10-CM | POA: Diagnosis not present

## 2017-08-13 DIAGNOSIS — Z1331 Encounter for screening for depression: Secondary | ICD-10-CM | POA: Diagnosis not present

## 2017-08-13 DIAGNOSIS — E785 Hyperlipidemia, unspecified: Secondary | ICD-10-CM | POA: Diagnosis not present

## 2017-08-13 DIAGNOSIS — Z136 Encounter for screening for cardiovascular disorders: Secondary | ICD-10-CM | POA: Diagnosis not present

## 2017-08-30 DIAGNOSIS — M810 Age-related osteoporosis without current pathological fracture: Secondary | ICD-10-CM | POA: Diagnosis not present

## 2017-08-31 DIAGNOSIS — R300 Dysuria: Secondary | ICD-10-CM | POA: Diagnosis not present

## 2017-08-31 DIAGNOSIS — N390 Urinary tract infection, site not specified: Secondary | ICD-10-CM | POA: Diagnosis not present

## 2017-08-31 DIAGNOSIS — N3020 Other chronic cystitis without hematuria: Secondary | ICD-10-CM | POA: Diagnosis not present

## 2017-09-25 DIAGNOSIS — R300 Dysuria: Secondary | ICD-10-CM | POA: Diagnosis not present

## 2017-09-25 DIAGNOSIS — I10 Essential (primary) hypertension: Secondary | ICD-10-CM | POA: Diagnosis not present

## 2017-09-25 DIAGNOSIS — M359 Systemic involvement of connective tissue, unspecified: Secondary | ICD-10-CM | POA: Diagnosis not present

## 2017-09-25 DIAGNOSIS — E785 Hyperlipidemia, unspecified: Secondary | ICD-10-CM | POA: Diagnosis not present

## 2017-09-25 DIAGNOSIS — I2510 Atherosclerotic heart disease of native coronary artery without angina pectoris: Secondary | ICD-10-CM | POA: Diagnosis not present

## 2017-09-25 DIAGNOSIS — Z79899 Other long term (current) drug therapy: Secondary | ICD-10-CM | POA: Diagnosis not present

## 2017-11-14 DIAGNOSIS — M8589 Other specified disorders of bone density and structure, multiple sites: Secondary | ICD-10-CM | POA: Diagnosis not present

## 2017-11-14 DIAGNOSIS — Z1231 Encounter for screening mammogram for malignant neoplasm of breast: Secondary | ICD-10-CM | POA: Diagnosis not present

## 2018-01-23 DIAGNOSIS — I25118 Atherosclerotic heart disease of native coronary artery with other forms of angina pectoris: Secondary | ICD-10-CM | POA: Diagnosis not present

## 2018-01-23 DIAGNOSIS — I252 Old myocardial infarction: Secondary | ICD-10-CM | POA: Diagnosis not present

## 2018-01-29 DIAGNOSIS — E663 Overweight: Secondary | ICD-10-CM | POA: Diagnosis not present

## 2018-01-29 DIAGNOSIS — M359 Systemic involvement of connective tissue, unspecified: Secondary | ICD-10-CM | POA: Diagnosis not present

## 2018-01-29 DIAGNOSIS — N3020 Other chronic cystitis without hematuria: Secondary | ICD-10-CM | POA: Diagnosis not present

## 2018-01-29 DIAGNOSIS — E785 Hyperlipidemia, unspecified: Secondary | ICD-10-CM | POA: Diagnosis not present

## 2018-01-29 DIAGNOSIS — Z79899 Other long term (current) drug therapy: Secondary | ICD-10-CM | POA: Diagnosis not present

## 2018-01-29 DIAGNOSIS — Z139 Encounter for screening, unspecified: Secondary | ICD-10-CM | POA: Diagnosis not present

## 2018-01-29 DIAGNOSIS — Z6827 Body mass index (BMI) 27.0-27.9, adult: Secondary | ICD-10-CM | POA: Diagnosis not present

## 2018-01-29 DIAGNOSIS — Z1339 Encounter for screening examination for other mental health and behavioral disorders: Secondary | ICD-10-CM | POA: Diagnosis not present

## 2018-01-29 DIAGNOSIS — I10 Essential (primary) hypertension: Secondary | ICD-10-CM | POA: Diagnosis not present

## 2018-02-19 DIAGNOSIS — E663 Overweight: Secondary | ICD-10-CM | POA: Diagnosis not present

## 2018-02-19 DIAGNOSIS — M79642 Pain in left hand: Secondary | ICD-10-CM | POA: Diagnosis not present

## 2018-02-19 DIAGNOSIS — Z6827 Body mass index (BMI) 27.0-27.9, adult: Secondary | ICD-10-CM | POA: Diagnosis not present

## 2018-02-19 DIAGNOSIS — M3500 Sicca syndrome, unspecified: Secondary | ICD-10-CM | POA: Diagnosis not present

## 2018-02-19 DIAGNOSIS — M65351 Trigger finger, right little finger: Secondary | ICD-10-CM | POA: Diagnosis not present

## 2018-02-19 DIAGNOSIS — M79641 Pain in right hand: Secondary | ICD-10-CM | POA: Diagnosis not present

## 2018-03-08 DIAGNOSIS — N390 Urinary tract infection, site not specified: Secondary | ICD-10-CM | POA: Diagnosis not present

## 2018-03-08 DIAGNOSIS — M810 Age-related osteoporosis without current pathological fracture: Secondary | ICD-10-CM | POA: Diagnosis not present

## 2018-03-14 DIAGNOSIS — M810 Age-related osteoporosis without current pathological fracture: Secondary | ICD-10-CM | POA: Diagnosis not present

## 2018-04-10 DIAGNOSIS — Z6828 Body mass index (BMI) 28.0-28.9, adult: Secondary | ICD-10-CM | POA: Diagnosis not present

## 2018-04-10 DIAGNOSIS — J310 Chronic rhinitis: Secondary | ICD-10-CM | POA: Diagnosis not present

## 2018-04-10 DIAGNOSIS — N390 Urinary tract infection, site not specified: Secondary | ICD-10-CM | POA: Diagnosis not present

## 2018-06-04 DIAGNOSIS — M8589 Other specified disorders of bone density and structure, multiple sites: Secondary | ICD-10-CM | POA: Diagnosis not present

## 2018-06-04 DIAGNOSIS — E785 Hyperlipidemia, unspecified: Secondary | ICD-10-CM | POA: Diagnosis not present

## 2018-06-04 DIAGNOSIS — M359 Systemic involvement of connective tissue, unspecified: Secondary | ICD-10-CM | POA: Diagnosis not present

## 2018-06-04 DIAGNOSIS — N3020 Other chronic cystitis without hematuria: Secondary | ICD-10-CM | POA: Diagnosis not present

## 2018-06-04 DIAGNOSIS — E039 Hypothyroidism, unspecified: Secondary | ICD-10-CM | POA: Diagnosis not present

## 2018-06-04 DIAGNOSIS — E559 Vitamin D deficiency, unspecified: Secondary | ICD-10-CM | POA: Diagnosis not present

## 2018-06-04 DIAGNOSIS — Z6828 Body mass index (BMI) 28.0-28.9, adult: Secondary | ICD-10-CM | POA: Diagnosis not present

## 2018-06-04 DIAGNOSIS — I10 Essential (primary) hypertension: Secondary | ICD-10-CM | POA: Diagnosis not present

## 2018-08-19 DIAGNOSIS — E785 Hyperlipidemia, unspecified: Secondary | ICD-10-CM | POA: Diagnosis not present

## 2018-08-19 DIAGNOSIS — Z1211 Encounter for screening for malignant neoplasm of colon: Secondary | ICD-10-CM | POA: Diagnosis not present

## 2018-08-19 DIAGNOSIS — Z1331 Encounter for screening for depression: Secondary | ICD-10-CM | POA: Diagnosis not present

## 2018-08-19 DIAGNOSIS — Z9181 History of falling: Secondary | ICD-10-CM | POA: Diagnosis not present

## 2018-08-19 DIAGNOSIS — Z1231 Encounter for screening mammogram for malignant neoplasm of breast: Secondary | ICD-10-CM | POA: Diagnosis not present

## 2018-08-19 DIAGNOSIS — Z0000 Encounter for general adult medical examination without abnormal findings: Secondary | ICD-10-CM | POA: Diagnosis not present

## 2018-09-04 DIAGNOSIS — M810 Age-related osteoporosis without current pathological fracture: Secondary | ICD-10-CM | POA: Diagnosis not present

## 2018-09-19 DIAGNOSIS — M810 Age-related osteoporosis without current pathological fracture: Secondary | ICD-10-CM | POA: Diagnosis not present

## 2018-10-15 DIAGNOSIS — E785 Hyperlipidemia, unspecified: Secondary | ICD-10-CM | POA: Diagnosis not present

## 2018-10-15 DIAGNOSIS — I10 Essential (primary) hypertension: Secondary | ICD-10-CM | POA: Diagnosis not present

## 2018-10-15 DIAGNOSIS — N3020 Other chronic cystitis without hematuria: Secondary | ICD-10-CM | POA: Diagnosis not present

## 2018-10-15 DIAGNOSIS — M359 Systemic involvement of connective tissue, unspecified: Secondary | ICD-10-CM | POA: Diagnosis not present

## 2018-10-15 DIAGNOSIS — Z6827 Body mass index (BMI) 27.0-27.9, adult: Secondary | ICD-10-CM | POA: Diagnosis not present

## 2018-10-15 DIAGNOSIS — M8589 Other specified disorders of bone density and structure, multiple sites: Secondary | ICD-10-CM | POA: Diagnosis not present

## 2018-10-15 DIAGNOSIS — E559 Vitamin D deficiency, unspecified: Secondary | ICD-10-CM | POA: Diagnosis not present

## 2018-10-15 DIAGNOSIS — M810 Age-related osteoporosis without current pathological fracture: Secondary | ICD-10-CM | POA: Diagnosis not present

## 2018-10-15 DIAGNOSIS — E039 Hypothyroidism, unspecified: Secondary | ICD-10-CM | POA: Diagnosis not present

## 2018-10-15 DIAGNOSIS — E663 Overweight: Secondary | ICD-10-CM | POA: Diagnosis not present

## 2018-10-15 DIAGNOSIS — Z79899 Other long term (current) drug therapy: Secondary | ICD-10-CM | POA: Diagnosis not present

## 2018-11-18 DIAGNOSIS — Z1231 Encounter for screening mammogram for malignant neoplasm of breast: Secondary | ICD-10-CM | POA: Diagnosis not present

## 2018-11-28 DIAGNOSIS — M359 Systemic involvement of connective tissue, unspecified: Secondary | ICD-10-CM | POA: Diagnosis not present

## 2018-11-28 DIAGNOSIS — E039 Hypothyroidism, unspecified: Secondary | ICD-10-CM | POA: Diagnosis not present

## 2018-11-28 DIAGNOSIS — L659 Nonscarring hair loss, unspecified: Secondary | ICD-10-CM | POA: Diagnosis not present

## 2018-11-28 DIAGNOSIS — Z6828 Body mass index (BMI) 28.0-28.9, adult: Secondary | ICD-10-CM | POA: Diagnosis not present

## 2018-11-28 DIAGNOSIS — R609 Edema, unspecified: Secondary | ICD-10-CM | POA: Diagnosis not present

## 2018-12-06 DIAGNOSIS — R609 Edema, unspecified: Secondary | ICD-10-CM | POA: Diagnosis not present

## 2018-12-06 DIAGNOSIS — E039 Hypothyroidism, unspecified: Secondary | ICD-10-CM | POA: Diagnosis not present

## 2018-12-06 DIAGNOSIS — Z6827 Body mass index (BMI) 27.0-27.9, adult: Secondary | ICD-10-CM | POA: Diagnosis not present

## 2018-12-16 DIAGNOSIS — E7849 Other hyperlipidemia: Secondary | ICD-10-CM | POA: Diagnosis not present

## 2018-12-16 DIAGNOSIS — I2510 Atherosclerotic heart disease of native coronary artery without angina pectoris: Secondary | ICD-10-CM | POA: Diagnosis not present

## 2018-12-16 DIAGNOSIS — I5021 Acute systolic (congestive) heart failure: Secondary | ICD-10-CM | POA: Diagnosis not present

## 2018-12-16 DIAGNOSIS — I252 Old myocardial infarction: Secondary | ICD-10-CM | POA: Diagnosis not present

## 2019-01-03 DIAGNOSIS — R109 Unspecified abdominal pain: Secondary | ICD-10-CM | POA: Diagnosis not present

## 2019-01-03 DIAGNOSIS — Z6828 Body mass index (BMI) 28.0-28.9, adult: Secondary | ICD-10-CM | POA: Diagnosis not present

## 2019-01-03 DIAGNOSIS — R609 Edema, unspecified: Secondary | ICD-10-CM | POA: Diagnosis not present

## 2019-01-03 DIAGNOSIS — M359 Systemic involvement of connective tissue, unspecified: Secondary | ICD-10-CM | POA: Diagnosis not present

## 2019-01-03 DIAGNOSIS — I2510 Atherosclerotic heart disease of native coronary artery without angina pectoris: Secondary | ICD-10-CM | POA: Diagnosis not present

## 2019-01-08 DIAGNOSIS — I2510 Atherosclerotic heart disease of native coronary artery without angina pectoris: Secondary | ICD-10-CM | POA: Diagnosis not present

## 2019-01-17 DIAGNOSIS — E039 Hypothyroidism, unspecified: Secondary | ICD-10-CM | POA: Diagnosis not present

## 2019-01-17 DIAGNOSIS — Z6828 Body mass index (BMI) 28.0-28.9, adult: Secondary | ICD-10-CM | POA: Diagnosis not present

## 2019-01-17 DIAGNOSIS — L609 Nail disorder, unspecified: Secondary | ICD-10-CM | POA: Diagnosis not present

## 2019-01-17 DIAGNOSIS — R609 Edema, unspecified: Secondary | ICD-10-CM | POA: Diagnosis not present

## 2019-02-18 DIAGNOSIS — I10 Essential (primary) hypertension: Secondary | ICD-10-CM | POA: Diagnosis not present

## 2019-02-18 DIAGNOSIS — M8589 Other specified disorders of bone density and structure, multiple sites: Secondary | ICD-10-CM | POA: Diagnosis not present

## 2019-02-18 DIAGNOSIS — E785 Hyperlipidemia, unspecified: Secondary | ICD-10-CM | POA: Diagnosis not present

## 2019-02-18 DIAGNOSIS — E559 Vitamin D deficiency, unspecified: Secondary | ICD-10-CM | POA: Diagnosis not present

## 2019-02-18 DIAGNOSIS — Z79899 Other long term (current) drug therapy: Secondary | ICD-10-CM | POA: Diagnosis not present

## 2019-02-18 DIAGNOSIS — Z6828 Body mass index (BMI) 28.0-28.9, adult: Secondary | ICD-10-CM | POA: Diagnosis not present

## 2019-02-18 DIAGNOSIS — M810 Age-related osteoporosis without current pathological fracture: Secondary | ICD-10-CM | POA: Diagnosis not present

## 2019-02-18 DIAGNOSIS — E039 Hypothyroidism, unspecified: Secondary | ICD-10-CM | POA: Diagnosis not present

## 2019-03-13 DIAGNOSIS — M810 Age-related osteoporosis without current pathological fracture: Secondary | ICD-10-CM | POA: Diagnosis not present

## 2019-04-18 DIAGNOSIS — M810 Age-related osteoporosis without current pathological fracture: Secondary | ICD-10-CM | POA: Diagnosis not present

## 2019-04-18 DIAGNOSIS — N390 Urinary tract infection, site not specified: Secondary | ICD-10-CM | POA: Diagnosis not present

## 2019-04-25 DIAGNOSIS — H25812 Combined forms of age-related cataract, left eye: Secondary | ICD-10-CM | POA: Diagnosis not present

## 2019-04-25 DIAGNOSIS — H25811 Combined forms of age-related cataract, right eye: Secondary | ICD-10-CM | POA: Diagnosis not present

## 2019-04-25 DIAGNOSIS — Z01818 Encounter for other preprocedural examination: Secondary | ICD-10-CM | POA: Diagnosis not present

## 2019-05-16 DIAGNOSIS — H25812 Combined forms of age-related cataract, left eye: Secondary | ICD-10-CM | POA: Diagnosis not present

## 2019-05-16 DIAGNOSIS — H2512 Age-related nuclear cataract, left eye: Secondary | ICD-10-CM | POA: Diagnosis not present

## 2019-05-29 DIAGNOSIS — H2511 Age-related nuclear cataract, right eye: Secondary | ICD-10-CM | POA: Diagnosis not present

## 2019-05-29 DIAGNOSIS — H25811 Combined forms of age-related cataract, right eye: Secondary | ICD-10-CM | POA: Diagnosis not present

## 2019-06-13 DIAGNOSIS — M359 Systemic involvement of connective tissue, unspecified: Secondary | ICD-10-CM | POA: Diagnosis not present

## 2019-06-13 DIAGNOSIS — R609 Edema, unspecified: Secondary | ICD-10-CM | POA: Diagnosis not present

## 2019-06-13 DIAGNOSIS — Z6828 Body mass index (BMI) 28.0-28.9, adult: Secondary | ICD-10-CM | POA: Diagnosis not present

## 2019-06-20 DIAGNOSIS — E785 Hyperlipidemia, unspecified: Secondary | ICD-10-CM | POA: Diagnosis not present

## 2019-06-20 DIAGNOSIS — L659 Nonscarring hair loss, unspecified: Secondary | ICD-10-CM | POA: Diagnosis not present

## 2019-06-20 DIAGNOSIS — R609 Edema, unspecified: Secondary | ICD-10-CM | POA: Diagnosis not present

## 2019-06-20 DIAGNOSIS — Z79899 Other long term (current) drug therapy: Secondary | ICD-10-CM | POA: Diagnosis not present

## 2019-06-20 DIAGNOSIS — E559 Vitamin D deficiency, unspecified: Secondary | ICD-10-CM | POA: Diagnosis not present

## 2019-06-20 DIAGNOSIS — R300 Dysuria: Secondary | ICD-10-CM | POA: Diagnosis not present

## 2019-06-20 DIAGNOSIS — Z6827 Body mass index (BMI) 27.0-27.9, adult: Secondary | ICD-10-CM | POA: Diagnosis not present

## 2019-06-20 DIAGNOSIS — Z139 Encounter for screening, unspecified: Secondary | ICD-10-CM | POA: Diagnosis not present

## 2019-06-20 DIAGNOSIS — M8589 Other specified disorders of bone density and structure, multiple sites: Secondary | ICD-10-CM | POA: Diagnosis not present

## 2019-06-20 DIAGNOSIS — M810 Age-related osteoporosis without current pathological fracture: Secondary | ICD-10-CM | POA: Diagnosis not present

## 2019-06-20 DIAGNOSIS — E039 Hypothyroidism, unspecified: Secondary | ICD-10-CM | POA: Diagnosis not present

## 2019-06-20 DIAGNOSIS — I10 Essential (primary) hypertension: Secondary | ICD-10-CM | POA: Diagnosis not present

## 2019-07-09 DIAGNOSIS — K5792 Diverticulitis of intestine, part unspecified, without perforation or abscess without bleeding: Secondary | ICD-10-CM | POA: Diagnosis not present

## 2019-08-25 DIAGNOSIS — Z1331 Encounter for screening for depression: Secondary | ICD-10-CM | POA: Diagnosis not present

## 2019-08-25 DIAGNOSIS — E785 Hyperlipidemia, unspecified: Secondary | ICD-10-CM | POA: Diagnosis not present

## 2019-08-25 DIAGNOSIS — Z9181 History of falling: Secondary | ICD-10-CM | POA: Diagnosis not present

## 2019-08-25 DIAGNOSIS — Z0000 Encounter for general adult medical examination without abnormal findings: Secondary | ICD-10-CM | POA: Diagnosis not present

## 2019-09-30 DIAGNOSIS — E785 Hyperlipidemia, unspecified: Secondary | ICD-10-CM | POA: Diagnosis not present

## 2019-09-30 DIAGNOSIS — I10 Essential (primary) hypertension: Secondary | ICD-10-CM | POA: Diagnosis not present

## 2019-10-28 DIAGNOSIS — E559 Vitamin D deficiency, unspecified: Secondary | ICD-10-CM | POA: Diagnosis not present

## 2019-10-28 DIAGNOSIS — M359 Systemic involvement of connective tissue, unspecified: Secondary | ICD-10-CM | POA: Diagnosis not present

## 2019-10-28 DIAGNOSIS — R609 Edema, unspecified: Secondary | ICD-10-CM | POA: Diagnosis not present

## 2019-10-28 DIAGNOSIS — M8589 Other specified disorders of bone density and structure, multiple sites: Secondary | ICD-10-CM | POA: Diagnosis not present

## 2019-10-28 DIAGNOSIS — I10 Essential (primary) hypertension: Secondary | ICD-10-CM | POA: Diagnosis not present

## 2019-10-28 DIAGNOSIS — E039 Hypothyroidism, unspecified: Secondary | ICD-10-CM | POA: Diagnosis not present

## 2019-10-28 DIAGNOSIS — L659 Nonscarring hair loss, unspecified: Secondary | ICD-10-CM | POA: Diagnosis not present

## 2019-10-28 DIAGNOSIS — M810 Age-related osteoporosis without current pathological fracture: Secondary | ICD-10-CM | POA: Diagnosis not present

## 2019-10-28 DIAGNOSIS — E785 Hyperlipidemia, unspecified: Secondary | ICD-10-CM | POA: Diagnosis not present

## 2019-10-28 DIAGNOSIS — Z79899 Other long term (current) drug therapy: Secondary | ICD-10-CM | POA: Diagnosis not present

## 2019-10-28 DIAGNOSIS — Z6827 Body mass index (BMI) 27.0-27.9, adult: Secondary | ICD-10-CM | POA: Diagnosis not present

## 2019-11-19 DIAGNOSIS — N959 Unspecified menopausal and perimenopausal disorder: Secondary | ICD-10-CM | POA: Diagnosis not present

## 2019-11-19 DIAGNOSIS — M8589 Other specified disorders of bone density and structure, multiple sites: Secondary | ICD-10-CM | POA: Diagnosis not present

## 2019-11-19 DIAGNOSIS — Z1231 Encounter for screening mammogram for malignant neoplasm of breast: Secondary | ICD-10-CM | POA: Diagnosis not present

## 2020-01-05 DIAGNOSIS — I2510 Atherosclerotic heart disease of native coronary artery without angina pectoris: Secondary | ICD-10-CM | POA: Diagnosis not present

## 2020-01-05 DIAGNOSIS — I252 Old myocardial infarction: Secondary | ICD-10-CM | POA: Diagnosis not present

## 2020-01-05 DIAGNOSIS — E785 Hyperlipidemia, unspecified: Secondary | ICD-10-CM | POA: Diagnosis not present

## 2020-01-05 DIAGNOSIS — R600 Localized edema: Secondary | ICD-10-CM | POA: Diagnosis not present

## 2020-03-02 DIAGNOSIS — I10 Essential (primary) hypertension: Secondary | ICD-10-CM | POA: Diagnosis not present

## 2020-03-02 DIAGNOSIS — E039 Hypothyroidism, unspecified: Secondary | ICD-10-CM | POA: Diagnosis not present

## 2020-03-02 DIAGNOSIS — R609 Edema, unspecified: Secondary | ICD-10-CM | POA: Diagnosis not present

## 2020-03-02 DIAGNOSIS — E559 Vitamin D deficiency, unspecified: Secondary | ICD-10-CM | POA: Diagnosis not present

## 2020-03-02 DIAGNOSIS — Z79899 Other long term (current) drug therapy: Secondary | ICD-10-CM | POA: Diagnosis not present

## 2020-03-02 DIAGNOSIS — M25561 Pain in right knee: Secondary | ICD-10-CM | POA: Diagnosis not present

## 2020-03-02 DIAGNOSIS — Z6827 Body mass index (BMI) 27.0-27.9, adult: Secondary | ICD-10-CM | POA: Diagnosis not present

## 2020-03-02 DIAGNOSIS — M359 Systemic involvement of connective tissue, unspecified: Secondary | ICD-10-CM | POA: Diagnosis not present

## 2020-03-02 DIAGNOSIS — L659 Nonscarring hair loss, unspecified: Secondary | ICD-10-CM | POA: Diagnosis not present

## 2020-03-02 DIAGNOSIS — M8589 Other specified disorders of bone density and structure, multiple sites: Secondary | ICD-10-CM | POA: Diagnosis not present

## 2020-03-02 DIAGNOSIS — E785 Hyperlipidemia, unspecified: Secondary | ICD-10-CM | POA: Diagnosis not present

## 2020-05-21 DIAGNOSIS — M810 Age-related osteoporosis without current pathological fracture: Secondary | ICD-10-CM | POA: Diagnosis not present

## 2020-07-06 DIAGNOSIS — M810 Age-related osteoporosis without current pathological fracture: Secondary | ICD-10-CM | POA: Diagnosis not present

## 2020-07-06 DIAGNOSIS — L659 Nonscarring hair loss, unspecified: Secondary | ICD-10-CM | POA: Diagnosis not present

## 2020-07-06 DIAGNOSIS — M359 Systemic involvement of connective tissue, unspecified: Secondary | ICD-10-CM | POA: Diagnosis not present

## 2020-07-06 DIAGNOSIS — Z6827 Body mass index (BMI) 27.0-27.9, adult: Secondary | ICD-10-CM | POA: Diagnosis not present

## 2020-07-06 DIAGNOSIS — E039 Hypothyroidism, unspecified: Secondary | ICD-10-CM | POA: Diagnosis not present

## 2020-07-06 DIAGNOSIS — R609 Edema, unspecified: Secondary | ICD-10-CM | POA: Diagnosis not present

## 2020-07-06 DIAGNOSIS — E785 Hyperlipidemia, unspecified: Secondary | ICD-10-CM | POA: Diagnosis not present

## 2020-07-06 DIAGNOSIS — M8589 Other specified disorders of bone density and structure, multiple sites: Secondary | ICD-10-CM | POA: Diagnosis not present

## 2020-07-06 DIAGNOSIS — I10 Essential (primary) hypertension: Secondary | ICD-10-CM | POA: Diagnosis not present

## 2020-07-06 DIAGNOSIS — E559 Vitamin D deficiency, unspecified: Secondary | ICD-10-CM | POA: Diagnosis not present

## 2020-07-06 DIAGNOSIS — Z79899 Other long term (current) drug therapy: Secondary | ICD-10-CM | POA: Diagnosis not present

## 2020-08-03 DIAGNOSIS — H524 Presbyopia: Secondary | ICD-10-CM | POA: Diagnosis not present

## 2020-09-13 DIAGNOSIS — Z0000 Encounter for general adult medical examination without abnormal findings: Secondary | ICD-10-CM | POA: Diagnosis not present

## 2020-09-13 DIAGNOSIS — Z9181 History of falling: Secondary | ICD-10-CM | POA: Diagnosis not present

## 2020-09-13 DIAGNOSIS — Z1331 Encounter for screening for depression: Secondary | ICD-10-CM | POA: Diagnosis not present

## 2020-09-13 DIAGNOSIS — Z139 Encounter for screening, unspecified: Secondary | ICD-10-CM | POA: Diagnosis not present

## 2020-09-13 DIAGNOSIS — E785 Hyperlipidemia, unspecified: Secondary | ICD-10-CM | POA: Diagnosis not present

## 2020-09-30 DIAGNOSIS — Z79899 Other long term (current) drug therapy: Secondary | ICD-10-CM | POA: Diagnosis not present

## 2020-10-14 DIAGNOSIS — R609 Edema, unspecified: Secondary | ICD-10-CM | POA: Diagnosis not present

## 2020-10-14 DIAGNOSIS — M25561 Pain in right knee: Secondary | ICD-10-CM | POA: Diagnosis not present

## 2020-10-14 DIAGNOSIS — Z6827 Body mass index (BMI) 27.0-27.9, adult: Secondary | ICD-10-CM | POA: Diagnosis not present

## 2020-10-14 DIAGNOSIS — M359 Systemic involvement of connective tissue, unspecified: Secondary | ICD-10-CM | POA: Diagnosis not present

## 2020-11-25 DIAGNOSIS — L659 Nonscarring hair loss, unspecified: Secondary | ICD-10-CM | POA: Diagnosis not present

## 2020-11-25 DIAGNOSIS — R609 Edema, unspecified: Secondary | ICD-10-CM | POA: Diagnosis not present

## 2020-11-25 DIAGNOSIS — E559 Vitamin D deficiency, unspecified: Secondary | ICD-10-CM | POA: Diagnosis not present

## 2020-11-25 DIAGNOSIS — M359 Systemic involvement of connective tissue, unspecified: Secondary | ICD-10-CM | POA: Diagnosis not present

## 2020-11-25 DIAGNOSIS — I2510 Atherosclerotic heart disease of native coronary artery without angina pectoris: Secondary | ICD-10-CM | POA: Diagnosis not present

## 2020-11-25 DIAGNOSIS — E039 Hypothyroidism, unspecified: Secondary | ICD-10-CM | POA: Diagnosis not present

## 2020-11-25 DIAGNOSIS — E785 Hyperlipidemia, unspecified: Secondary | ICD-10-CM | POA: Diagnosis not present

## 2020-11-25 DIAGNOSIS — M810 Age-related osteoporosis without current pathological fracture: Secondary | ICD-10-CM | POA: Diagnosis not present

## 2020-11-25 DIAGNOSIS — I10 Essential (primary) hypertension: Secondary | ICD-10-CM | POA: Diagnosis not present

## 2020-11-25 DIAGNOSIS — Z79899 Other long term (current) drug therapy: Secondary | ICD-10-CM | POA: Diagnosis not present

## 2020-11-25 DIAGNOSIS — Z6827 Body mass index (BMI) 27.0-27.9, adult: Secondary | ICD-10-CM | POA: Diagnosis not present

## 2020-11-26 DIAGNOSIS — Z1231 Encounter for screening mammogram for malignant neoplasm of breast: Secondary | ICD-10-CM | POA: Diagnosis not present

## 2020-12-29 DIAGNOSIS — E7849 Other hyperlipidemia: Secondary | ICD-10-CM | POA: Diagnosis not present

## 2020-12-29 DIAGNOSIS — I252 Old myocardial infarction: Secondary | ICD-10-CM | POA: Diagnosis not present

## 2020-12-29 DIAGNOSIS — I25118 Atherosclerotic heart disease of native coronary artery with other forms of angina pectoris: Secondary | ICD-10-CM | POA: Diagnosis not present

## 2020-12-29 DIAGNOSIS — M7989 Other specified soft tissue disorders: Secondary | ICD-10-CM | POA: Diagnosis not present

## 2020-12-30 DIAGNOSIS — I4510 Unspecified right bundle-branch block: Secondary | ICD-10-CM | POA: Diagnosis not present

## 2020-12-30 DIAGNOSIS — I2510 Atherosclerotic heart disease of native coronary artery without angina pectoris: Secondary | ICD-10-CM | POA: Diagnosis not present

## 2021-01-14 DIAGNOSIS — R300 Dysuria: Secondary | ICD-10-CM | POA: Diagnosis not present

## 2021-01-14 DIAGNOSIS — R609 Edema, unspecified: Secondary | ICD-10-CM | POA: Diagnosis not present

## 2021-01-14 DIAGNOSIS — M810 Age-related osteoporosis without current pathological fracture: Secondary | ICD-10-CM | POA: Diagnosis not present

## 2021-01-14 DIAGNOSIS — M542 Cervicalgia: Secondary | ICD-10-CM | POA: Diagnosis not present

## 2021-01-14 DIAGNOSIS — M359 Systemic involvement of connective tissue, unspecified: Secondary | ICD-10-CM | POA: Diagnosis not present

## 2021-01-14 DIAGNOSIS — N390 Urinary tract infection, site not specified: Secondary | ICD-10-CM | POA: Diagnosis not present

## 2021-01-29 DIAGNOSIS — I358 Other nonrheumatic aortic valve disorders: Secondary | ICD-10-CM | POA: Diagnosis not present

## 2021-02-03 DIAGNOSIS — M810 Age-related osteoporosis without current pathological fracture: Secondary | ICD-10-CM | POA: Diagnosis not present

## 2021-03-22 DIAGNOSIS — M810 Age-related osteoporosis without current pathological fracture: Secondary | ICD-10-CM | POA: Diagnosis not present

## 2021-03-22 DIAGNOSIS — Z79899 Other long term (current) drug therapy: Secondary | ICD-10-CM | POA: Diagnosis not present

## 2021-03-22 DIAGNOSIS — I2510 Atherosclerotic heart disease of native coronary artery without angina pectoris: Secondary | ICD-10-CM | POA: Diagnosis not present

## 2021-03-22 DIAGNOSIS — D539 Nutritional anemia, unspecified: Secondary | ICD-10-CM | POA: Diagnosis not present

## 2021-03-22 DIAGNOSIS — R059 Cough, unspecified: Secondary | ICD-10-CM | POA: Diagnosis not present

## 2021-03-22 DIAGNOSIS — E039 Hypothyroidism, unspecified: Secondary | ICD-10-CM | POA: Diagnosis not present

## 2021-03-22 DIAGNOSIS — I10 Essential (primary) hypertension: Secondary | ICD-10-CM | POA: Diagnosis not present

## 2021-03-22 DIAGNOSIS — R609 Edema, unspecified: Secondary | ICD-10-CM | POA: Diagnosis not present

## 2021-03-22 DIAGNOSIS — Z6827 Body mass index (BMI) 27.0-27.9, adult: Secondary | ICD-10-CM | POA: Diagnosis not present

## 2021-03-22 DIAGNOSIS — M359 Systemic involvement of connective tissue, unspecified: Secondary | ICD-10-CM | POA: Diagnosis not present

## 2021-03-22 DIAGNOSIS — E559 Vitamin D deficiency, unspecified: Secondary | ICD-10-CM | POA: Diagnosis not present

## 2021-03-22 DIAGNOSIS — E785 Hyperlipidemia, unspecified: Secondary | ICD-10-CM | POA: Diagnosis not present

## 2021-04-28 DIAGNOSIS — Z1212 Encounter for screening for malignant neoplasm of rectum: Secondary | ICD-10-CM | POA: Diagnosis not present

## 2021-04-28 DIAGNOSIS — D500 Iron deficiency anemia secondary to blood loss (chronic): Secondary | ICD-10-CM | POA: Diagnosis not present

## 2021-05-17 DIAGNOSIS — D500 Iron deficiency anemia secondary to blood loss (chronic): Secondary | ICD-10-CM | POA: Insufficient documentation

## 2021-06-03 DIAGNOSIS — K5730 Diverticulosis of large intestine without perforation or abscess without bleeding: Secondary | ICD-10-CM | POA: Diagnosis not present

## 2021-06-03 DIAGNOSIS — Z86010 Personal history of colonic polyps: Secondary | ICD-10-CM | POA: Diagnosis not present

## 2021-06-03 DIAGNOSIS — Z1211 Encounter for screening for malignant neoplasm of colon: Secondary | ICD-10-CM | POA: Diagnosis not present

## 2021-06-03 DIAGNOSIS — C186 Malignant neoplasm of descending colon: Secondary | ICD-10-CM | POA: Diagnosis not present

## 2021-06-03 DIAGNOSIS — D500 Iron deficiency anemia secondary to blood loss (chronic): Secondary | ICD-10-CM | POA: Diagnosis not present

## 2021-06-03 DIAGNOSIS — Z800 Family history of malignant neoplasm of digestive organs: Secondary | ICD-10-CM | POA: Diagnosis not present

## 2021-06-06 ENCOUNTER — Other Ambulatory Visit: Payer: Self-pay | Admitting: Unknown Physician Specialty

## 2021-06-06 DIAGNOSIS — C186 Malignant neoplasm of descending colon: Secondary | ICD-10-CM

## 2021-07-13 ENCOUNTER — Ambulatory Visit
Admission: RE | Admit: 2021-07-13 | Discharge: 2021-07-13 | Disposition: A | Discharge status: Home / Self Care | Payer: PPO | Source: Ambulatory Visit | Attending: Unknown Physician Specialty | Admitting: Unknown Physician Specialty

## 2021-07-13 DIAGNOSIS — K5730 Diverticulosis of large intestine without perforation or abscess without bleeding: Secondary | ICD-10-CM | POA: Diagnosis not present

## 2021-07-13 DIAGNOSIS — D7389 Other diseases of spleen: Secondary | ICD-10-CM | POA: Diagnosis not present

## 2021-07-13 DIAGNOSIS — C186 Malignant neoplasm of descending colon: Secondary | ICD-10-CM

## 2021-07-13 DIAGNOSIS — K6389 Other specified diseases of intestine: Secondary | ICD-10-CM | POA: Diagnosis not present

## 2021-07-20 DIAGNOSIS — C186 Malignant neoplasm of descending colon: Secondary | ICD-10-CM | POA: Diagnosis not present

## 2021-07-21 DIAGNOSIS — M359 Systemic involvement of connective tissue, unspecified: Secondary | ICD-10-CM | POA: Diagnosis not present

## 2021-07-21 DIAGNOSIS — E785 Hyperlipidemia, unspecified: Secondary | ICD-10-CM | POA: Diagnosis not present

## 2021-07-21 DIAGNOSIS — E559 Vitamin D deficiency, unspecified: Secondary | ICD-10-CM | POA: Diagnosis not present

## 2021-07-21 DIAGNOSIS — I10 Essential (primary) hypertension: Secondary | ICD-10-CM | POA: Diagnosis not present

## 2021-07-21 DIAGNOSIS — R609 Edema, unspecified: Secondary | ICD-10-CM | POA: Diagnosis not present

## 2021-07-21 DIAGNOSIS — I2510 Atherosclerotic heart disease of native coronary artery without angina pectoris: Secondary | ICD-10-CM | POA: Diagnosis not present

## 2021-07-21 DIAGNOSIS — D539 Nutritional anemia, unspecified: Secondary | ICD-10-CM | POA: Diagnosis not present

## 2021-07-21 DIAGNOSIS — C186 Malignant neoplasm of descending colon: Secondary | ICD-10-CM | POA: Diagnosis not present

## 2021-07-21 DIAGNOSIS — M810 Age-related osteoporosis without current pathological fracture: Secondary | ICD-10-CM | POA: Diagnosis not present

## 2021-07-21 DIAGNOSIS — E039 Hypothyroidism, unspecified: Secondary | ICD-10-CM | POA: Diagnosis not present

## 2021-08-04 DIAGNOSIS — M810 Age-related osteoporosis without current pathological fracture: Secondary | ICD-10-CM | POA: Diagnosis not present

## 2021-08-05 DIAGNOSIS — C186 Malignant neoplasm of descending colon: Secondary | ICD-10-CM | POA: Diagnosis not present

## 2021-08-05 DIAGNOSIS — N390 Urinary tract infection, site not specified: Secondary | ICD-10-CM | POA: Diagnosis not present

## 2021-08-11 DIAGNOSIS — I252 Old myocardial infarction: Secondary | ICD-10-CM | POA: Diagnosis not present

## 2021-08-11 DIAGNOSIS — Z7982 Long term (current) use of aspirin: Secondary | ICD-10-CM | POA: Diagnosis not present

## 2021-08-11 DIAGNOSIS — K5720 Diverticulitis of large intestine with perforation and abscess without bleeding: Secondary | ICD-10-CM | POA: Diagnosis not present

## 2021-08-11 DIAGNOSIS — C772 Secondary and unspecified malignant neoplasm of intra-abdominal lymph nodes: Secondary | ICD-10-CM | POA: Diagnosis not present

## 2021-08-11 DIAGNOSIS — M069 Rheumatoid arthritis, unspecified: Secondary | ICD-10-CM | POA: Diagnosis not present

## 2021-08-11 DIAGNOSIS — C189 Malignant neoplasm of colon, unspecified: Secondary | ICD-10-CM | POA: Diagnosis not present

## 2021-08-11 DIAGNOSIS — I10 Essential (primary) hypertension: Secondary | ICD-10-CM | POA: Diagnosis not present

## 2021-08-11 DIAGNOSIS — C186 Malignant neoplasm of descending colon: Secondary | ICD-10-CM | POA: Diagnosis not present

## 2021-08-11 DIAGNOSIS — Z955 Presence of coronary angioplasty implant and graft: Secondary | ICD-10-CM | POA: Diagnosis not present

## 2021-08-11 DIAGNOSIS — Z79899 Other long term (current) drug therapy: Secondary | ICD-10-CM | POA: Diagnosis not present

## 2021-08-11 DIAGNOSIS — K219 Gastro-esophageal reflux disease without esophagitis: Secondary | ICD-10-CM | POA: Diagnosis not present

## 2021-08-11 DIAGNOSIS — Z7952 Long term (current) use of systemic steroids: Secondary | ICD-10-CM | POA: Diagnosis not present

## 2021-08-11 DIAGNOSIS — M1990 Unspecified osteoarthritis, unspecified site: Secondary | ICD-10-CM | POA: Diagnosis not present

## 2021-08-11 DIAGNOSIS — C188 Malignant neoplasm of overlapping sites of colon: Secondary | ICD-10-CM | POA: Diagnosis not present

## 2021-08-11 DIAGNOSIS — K5730 Diverticulosis of large intestine without perforation or abscess without bleeding: Secondary | ICD-10-CM | POA: Diagnosis not present

## 2021-08-11 DIAGNOSIS — C187 Malignant neoplasm of sigmoid colon: Secondary | ICD-10-CM | POA: Diagnosis not present

## 2021-08-11 DIAGNOSIS — E039 Hypothyroidism, unspecified: Secondary | ICD-10-CM | POA: Diagnosis not present

## 2021-08-25 ENCOUNTER — Other Ambulatory Visit: Payer: Self-pay | Admitting: Oncology

## 2021-08-25 DIAGNOSIS — C772 Secondary and unspecified malignant neoplasm of intra-abdominal lymph nodes: Secondary | ICD-10-CM | POA: Insufficient documentation

## 2021-08-25 DIAGNOSIS — C183 Malignant neoplasm of hepatic flexure: Secondary | ICD-10-CM

## 2021-08-25 DIAGNOSIS — C186 Malignant neoplasm of descending colon: Secondary | ICD-10-CM | POA: Insufficient documentation

## 2021-08-26 ENCOUNTER — Inpatient Hospital Stay: Payer: PPO | Attending: Oncology

## 2021-08-26 ENCOUNTER — Encounter: Payer: Self-pay | Admitting: Oncology

## 2021-08-26 ENCOUNTER — Inpatient Hospital Stay (INDEPENDENT_AMBULATORY_CARE_PROVIDER_SITE_OTHER): Payer: PPO | Admitting: Oncology

## 2021-08-26 VITALS — BP 143/65 | HR 55 | Temp 97.8°F | Resp 18 | Ht 61.7 in | Wt 139.3 lb

## 2021-08-26 DIAGNOSIS — C183 Malignant neoplasm of hepatic flexure: Secondary | ICD-10-CM

## 2021-08-26 DIAGNOSIS — Z8042 Family history of malignant neoplasm of prostate: Secondary | ICD-10-CM

## 2021-08-26 DIAGNOSIS — Z800 Family history of malignant neoplasm of digestive organs: Secondary | ICD-10-CM | POA: Diagnosis not present

## 2021-08-26 DIAGNOSIS — D500 Iron deficiency anemia secondary to blood loss (chronic): Secondary | ICD-10-CM | POA: Diagnosis not present

## 2021-08-26 DIAGNOSIS — C772 Secondary and unspecified malignant neoplasm of intra-abdominal lymph nodes: Secondary | ICD-10-CM

## 2021-08-26 DIAGNOSIS — C186 Malignant neoplasm of descending colon: Secondary | ICD-10-CM | POA: Insufficient documentation

## 2021-08-26 DIAGNOSIS — D649 Anemia, unspecified: Secondary | ICD-10-CM | POA: Diagnosis not present

## 2021-08-26 LAB — BASIC METABOLIC PANEL
BUN: 26 — AB (ref 4–21)
CO2: 21 (ref 13–22)
Chloride: 109 — AB (ref 99–108)
Creatinine: 1 (ref 0.5–1.1)
Glucose: 88
Potassium: 3.9 mEq/L (ref 3.5–5.1)
Sodium: 141 (ref 137–147)

## 2021-08-26 LAB — CBC AND DIFFERENTIAL
HCT: 33 — AB (ref 36–46)
Hemoglobin: 10.9 — AB (ref 12.0–16.0)
Neutrophils Absolute: 3.51
Platelets: 245 10*3/uL (ref 150–400)
WBC: 5.4

## 2021-08-26 LAB — CBC: RBC: 3.65 — AB (ref 3.87–5.11)

## 2021-08-26 LAB — COMPREHENSIVE METABOLIC PANEL
Albumin: 3.8 (ref 3.5–5.0)
Calcium: 8.7 (ref 8.7–10.7)

## 2021-08-26 LAB — HEPATIC FUNCTION PANEL
ALT: 25 U/L (ref 7–35)
AST: 35 (ref 13–35)
Alkaline Phosphatase: 72 (ref 25–125)
Bilirubin, Total: 0.5

## 2021-08-29 LAB — CEA: CEA: 3.6 ng/mL (ref 0.0–4.7)

## 2021-09-08 ENCOUNTER — Ambulatory Visit: Payer: PPO | Admitting: Oncology

## 2021-09-08 ENCOUNTER — Other Ambulatory Visit: Payer: PPO

## 2021-09-09 ENCOUNTER — Telehealth: Payer: Self-pay

## 2021-09-09 NOTE — Telephone Encounter (Addendum)
  FW: TREATMENT DECISION Received: 2 weeks ago Jenna Hutchinson, Jenna Flock, RN Patient will be starting oral capecitabine. Order entered and she saw [facility] June 14.        Previous Messages    ----- Message -----  From: Jenna Kaplan, MD  Sent: 09/14/2021  10:03 AM EDT  To: Jenna Pickles, PA-C  Subject: RE: TREATMENT DECISION                         Orders in, notify Jenna Hutchinson also  ----- Message -----  From: Jenna Pickles, PA-C  Sent: 09/13/2021   1:26 PM EDT  To: Jenna Kaplan, MD  Subject: FW: TREATMENT DECISION                         She states she would like to go ahead with capecitibine. I scheduled her with Jenna Hutchinson and let her know if you could put the order in. Thank you  ----- Message -----  From: Velora Heckler, CMA  Sent: 09/12/2021   1:17 PM EDT  To: Jenna Pickles, PA-C  Subject: TREATMENT DECISION                             PATIENT IS REQUESTING CALL BACK FROM YOU.  [phone].  THANKS   ----- Message from Jenna Kaplan, MD sent at 09/08/2021  7:02 PM EDT ----- Regarding: call When I saw her 2 weeks ago, she was going to call and tell me her decision, whether to consider oral chemo with Xeloda or observation only.  No pressure but was wondering what she decided.  I don't remember a call but it has been so hectic for me.Marland KitchenMarland KitchenJust need to know what to set up, f/u or orders

## 2021-09-14 ENCOUNTER — Other Ambulatory Visit: Payer: Self-pay | Admitting: Oncology

## 2021-09-14 ENCOUNTER — Inpatient Hospital Stay: Payer: PPO

## 2021-09-14 ENCOUNTER — Other Ambulatory Visit (HOSPITAL_COMMUNITY): Payer: Self-pay

## 2021-09-14 ENCOUNTER — Telehealth: Payer: Self-pay | Admitting: Pharmacy Technician

## 2021-09-14 ENCOUNTER — Encounter: Payer: Self-pay | Admitting: Oncology

## 2021-09-14 ENCOUNTER — Inpatient Hospital Stay: Payer: PPO | Attending: Oncology

## 2021-09-14 ENCOUNTER — Telehealth: Payer: Self-pay

## 2021-09-14 ENCOUNTER — Other Ambulatory Visit: Payer: Self-pay

## 2021-09-14 DIAGNOSIS — C772 Secondary and unspecified malignant neoplasm of intra-abdominal lymph nodes: Secondary | ICD-10-CM

## 2021-09-14 DIAGNOSIS — C186 Malignant neoplasm of descending colon: Secondary | ICD-10-CM

## 2021-09-14 DIAGNOSIS — D649 Anemia, unspecified: Secondary | ICD-10-CM | POA: Diagnosis not present

## 2021-09-14 MED ORDER — CAPECITABINE 500 MG PO TABS
625.0000 mg/m2 | ORAL_TABLET | Freq: Two times a day (BID) | ORAL | 5 refills | Status: DC
  Filled 2021-09-14: qty 56, 14d supply, fill #0

## 2021-09-14 NOTE — Telephone Encounter (Signed)
Oral Oncology Pharmacist Encounter  Received new prescription for capecitabine (Xeloda) for the treatment of stage IIIB colon cancer for adjuvant therapy, planned duration of 6 months of therapy.  Labs will be drawn on 09/14/21 at patients appt for counseling on medication. Prescription dose and frequency assessed. Patient started on '625mg'$ /m2 per MD due to age >5.  Current medication list in Epic reviewed, DDIs with Xeloda identified: - losartan and carvedilol: xeloda can increase exposure of both medications and cause increased risk of side effects. Will have patient monitor.   Evaluated chart and no patient barriers to medication adherence noted.   Patient agreement for treatment documented in MD note on 09/13/2021 and 08/26/21.  Prescription has been e-scribed to the [facility] for benefits analysis and approval.  Oral Oncology Clinic will continue to follow for insurance authorization, copayment issues, initial counseling and start date.  Drema Halon, PharmD Hematology/Oncology Clinical Pharmacist [facility] [phone] 09/14/2021 1:14 PM

## 2021-09-14 NOTE — Telephone Encounter (Signed)
Received New start notification for  Capecitabine . Will update as we work through the benefits process.   Medication is non-formulary on patient's plan.  Submitted a Prior Authorization request to  [employer]  for  Capecitabine '500mg'$   via CoverMyMeds. Will update once we receive a response.   Key: [id number] - PA Case ID: [id number]

## 2021-09-14 NOTE — Progress Notes (Addendum)
Oral Chemotherapy Pharmacist Encounter  I spoke with patient in person for overview of: Xeloda (capecitabine) for the  treatment of stage IIIB colon cancer, planned duration for a total of 6 months.  Counseled patient on administration, dosing, side effects, monitoring, drug-food interactions, safe handling, storage, and disposal.  Patient will take Xeloda '500mg'$  tablets, 2 tablets ('1000mg'$ ) by mouth in AM and 2 tabs ('1000mg'$ ) by mouth in PM, within 30 minutes of finishing meals, for 14 days on, 7 days off, repeated every 21 days. Prescription modified after ok from MD to include "take for 14 days days on, 7 days off and repeat every 21 days"  Xeloda start date: 09/19/2021  Adverse effects include but are not limited to: fatigue, decreased blood counts, GI upset, diarrhea, mouth sores, and hand-foot syndrome.  Will have patient monitor blood pressure due to drug interactions too see what blood pressure is prior to and after starting xeloda. Patient will start monitoring blood pressure on 09/16/21.    Patient has anti-emetic on hand from last hospital visit and knows to take it if nausea develops.   Patient will obtain anti diarrheal and alert the office of 4 or more loose stools above baseline.  Labs from 09/14/21 assessed, no interventions needed.   Reviewed with patient importance of keeping a medication schedule and plan for any missed doses. No barriers to medication adherence identified.  Medication reconciliation performed and medication/allergy list updated.  Patient informed the pharmacy will reach out 5-7 days prior to needing next fill of Xeloda to coordinate continued medication acquisition to prevent break in therapy.  Patient will receive medication from [facility] for a copay of $11.35 and will be shipped on 09/15/21 to arrive by 09/17/21.   All questions answered.  Mrs. Cadiente voiced understanding and appreciation.   Medication education handout was given  to patient. Patient knows to call the office with questions or concerns. Oral Chemotherapy Clinic phone number provided to patient.   Follow up:  Patient will start on 09/19/21 and plan to see Dr. Hinton Rao ~10 days after starting medication with labs.  I will follow up with patient on Wednesday through phone call on 6/21 to review blood pressure logs.   Drema Halon, PharmD Hematology/Oncology Clinical Pharmacist [facility] [phone]

## 2021-09-14 NOTE — Progress Notes (Signed)
START ON PATHWAY REGIMEN - Colorectal     A cycle is every 21 days:     Capecitabine   **Always confirm dose/schedule in your pharmacy ordering system**  Patient Characteristics: Postoperative without Neoadjuvant Therapy, M0 (Pathologic Staging), Colon, Stage III, High Risk (pT4 or pN2) Tumor Location: Colon Therapeutic Status: Postoperative without Neoadjuvant Therapy, M0 (Pathologic Staging) AJCC M Category: cM0 AJCC T Category: pT3 AJCC N Category: pN2b AJCC 8 Stage Grouping: IIIC Intent of Therapy: Curative Intent, Discussed with Patient

## 2021-09-14 NOTE — Progress Notes (Signed)
[facility]  [address] 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  This is a T3N2BM0, stage IIIc, with 12 of 32 nodes positive I explained the poor prognosis with this stage of disease and my recommendation for chemotherapy.  I understand she is 81 years old but she has a good performance status and so I do recommend adjuvant treatment.  We discussed the options of FOLFOX versus 5-FU alone leucovorin versus oral capecitabine.  I would recommend this for 6 months.  Iron deficiency anemia I recommend that she continue oral iron supplement for another 3 months to replace her iron stores.   Orders Placed This Encounter  Procedures   CBC and differential    This external order was created through the Results Console.   CBC    This external order was created through the Results Console.   Basic metabolic panel    This external order was created through the Results Console.   Comprehensive metabolic panel    This external order was created through the Results Console.   Hepatic function panel    This external order was created through the Results Console.    I explained to the patient and her husband that she is at high risk for recurrent disease and I recommend chemotherapy.  We discussed the various options of FOLFOX as the most aggressive approach and capecitabine as a another reasonable alternative, especially considering her age.  I reviewed the schedule and potential toxicities.  She would like to have time to think it over and will call us back.  I did recommend that she stay on oral iron supplement for another 3 months and also that she increase her fluids.  I discussed the assessment and treatment plan  with the patient.  The patient was provided an opportunity to ask questions and all were answered.  The patient agreed with the plan and demonstrated an understanding of the instructions.  The patient was advised to call back if the symptoms worsen or if the condition fails to improve as anticipated.  Thank you for the opportunity to participate in the care of your patients.  I provided 60 minutes of face-to-face time during this this encounter and > 50% was spent counseling as documented under my assessment and plan.    Jenna Kaplan, MD Jenna Hutchinson [address] [place] [address] Dept: [phone] Dept Fax: [phone]   CHIEF COMPLAINT:  CC: Adenocarcinoma of the descending colon  Current Treatment: Recommendations for chemotherapy   HISTORY OF PRESENT ILLNESS:  Jenna Hutchinson is a 81 y.o. female with a history of newly diagnosed colon cancer who is referred in consultation with Dr. Orrin Hutchinson for assessment and management.  She presented with iron deficiency anemia and this normalized after oral iron supplement for 4 weeks.  She was referred to Dr. Nehemiah Hutchinson for colonoscopy.  She does have a history of a colon polyp and her last colonoscopy was 15 years ago.  This revealed advanced diverticulosis of the sigmoid colon associated with angulation, spasm and muscular hypertrophy, requiring a pediatric scope.  She was found to have a malignant mass of the descending colon but this could  not be passed with the scope.  Pathology revealed an invasive poorly differentiated adenocarcinoma arising in an adenoma with high-grade dysplasia.  Because of the limitations of the colonoscopy, she had a virtual colonography on April 12.  This confirmed an apple core lesion with irregular narrowing of the lumen at the splenic flexure, measuring approximately 4 cm.  She also had a sclerotic area of the left iliac bone which has  been stable since 2014, and extensive diverticulosis.  She was referred to the surgeon and on May 11 had a left hemicolectomy.  Final pathology revealed a signet ring adenocarcinoma which was poorly cohesive and arising in a tubular adenoma with high-grade dysplasia.  This was a 6.3 cm grade 3 lesion which penetrated the bowel wall and had lymphovascular invasion.  12 of 32 nodes were positive for metastasis, for a T3 N2b M0, stage IIIC adenocarcinoma of the colon.  MMR was normal and MSI stable.  She is recovering well from the surgery and did not realize that she might need additional treatment.  INTERVAL HISTORY:  Her abdomen is sore from the surgery but she is otherwise doing well.  Her appetite is good.  She did have some transient edema but this is resolving.  She denies nausea, vomiting or bowel problems.  She denies cough, wheezing or dyspnea.  She denies chest pain, fevers or chills.  CBC today reveals a hemoglobin of 10.9 with an MCV of 91 and normal white count, normal platelet count.  Comprehensive metabolic profile is unremarkable other than a BUN of 26.  CEA is 3.6.  I have reviewed her chart and materials related to her cancer extensively and collaborated history with the patient. Summary of oncologic history is as follows: Oncology History  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 ([place])  08/11/2021 Cancer Staging   Staging form: Colon and Rectum, AJCC 8th Edition - Clinical stage from 08/11/2021: Stage IIIC (cT3, cN2b, cM0) - Signed by Jenna Kaplan, MD on 09/14/2021 Histopathologic type: Adenocarcinoma, NOS Stage prefix: Initial diagnosis Total positive nodes: 12 Total nodes examined: 32 Histologic grade (G): G3 Histologic grading system: 4 grade system Laterality: Left Tumor size (mm): 50 Lymph-vascular invasion (LVI): LVI present/identified, NOS Diagnostic confirmation: Positive histology Specimen type: Excision Staged by: Managing  physician Tumor deposits (TD): Absent Carcinoembryonic antigen (CEA) (ng/mL): 3.6 Perineural invasion (PNI): Unknown Microsatellite instability (MSI): Stable KRAS mutation: Not assessed NRAS mutation: Not assessed BRAF mutation: Not assessed Stage used in treatment planning: Yes National guidelines used in treatment planning: Yes Type of national guideline used in treatment planning: NCCN Staging comments: Over 27 years old, will use capecitabine at reduced dose as tolerated   08/25/2021 Initial Diagnosis   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 ([place])   09/21/2021 -  Chemotherapy   Patient is on Treatment Plan : COLORECTAL Capecitabine q21d       HISTORY:   Past Medical History:  Diagnosis Date   Arthritis    HTN (hypertension)    Hypothyroidism    Osteoporosis   History of myocardial infarction in 2017, with placement of 4 stents  Past Surgical History:  Procedure Laterality Date   BLADDER SURGERY     COLON SURGERY     eyelid surgery    Right lumpectomy in 1983 for a benign lesion  Family History  Problem Relation Age of Onset   Colon cancer Mother    Prostate cancer Brother     Social History:  reports that  she has never smoked. She has never used smokeless tobacco. She reports that she does not drink alcohol and does not use drugs.The patient is accompanied by her husband today.  They have no children.  She continues to work in [employer].  She denies chemical exposures.  Allergies: No Known Allergies  Current Medications: Current Outpatient Medications  Medication Sig Dispense Refill   aspirin 81 MG chewable tablet Chew by mouth.     nitroGLYCERIN (NITROSTAT) 0.4 MG SL tablet DISSOLVE 1 TABLET UNDER THE TONGUE EVERY 5 MINUTES AS NEEDED FOR CHEST PAIN. CALL DOCTOR/ 911 IF TAKE 2 DOSES. MAX 3 TABLETS PER DAY     rosuvastatin (CRESTOR) 20 MG tablet Take 1 tablet by mouth daily.     capecitabine (XELODA) 500 MG tablet Take 2 tablets  (1,000 mg total) by mouth 2 (two) times daily after a meal. 56 tablet 5   carvedilol (COREG) 3.125 MG tablet Take 3.125 mg by mouth 2 (two) times daily.     Cholecalciferol 25 MCG (1000 UT) capsule Take by mouth.     fluticasone (FLONASE) 50 MCG/ACT nasal spray Place 2 sprays into both nostrils daily.     furosemide (LASIX) 20 MG tablet Take 20 mg by mouth daily.     hydroxychloroquine (PLAQUENIL) 200 MG tablet Take 200 mg by mouth 2 (two) times daily.     losartan (COZAAR) 25 MG tablet Take 25 mg by mouth daily.     predniSONE (DELTASONE) 10 MG tablet Take 10 mg by mouth daily as needed.     PROLIA 60 MG/ML SOSY injection Inject into the skin.     SYNTHROID 125 MCG tablet Take 125 mcg by mouth daily.     No current facility-administered medications for this visit.    REVIEW OF SYSTEMS:  Review of Systems  Constitutional: Negative.   HENT:  Negative.    Eyes: Negative.   Respiratory: Negative.    Cardiovascular: Negative.   Gastrointestinal:  Positive for abdominal pain.  Endocrine: Negative.   Genitourinary: Negative.    Musculoskeletal: Negative.   Skin: Negative.   Neurological: Negative.   Hematological: Negative.   Psychiatric/Behavioral: Negative.       VITALS:  Blood pressure (!) 143/65, pulse (!) 55, temperature 97.8 F (36.6 C), temperature source Oral, resp. rate 18, height 5' 1.7" (1.567 m), weight 139 lb 4.8 oz (63.2 kg), SpO2 99 %.  Wt Readings from Last 3 Encounters:  08/26/21 139 lb 4.8 oz (63.2 kg)    Body mass index is 25.73 kg/m.  Performance status (ECOG): 1 - Symptomatic but completely ambulatory  PHYSICAL EXAM:  Physical Exam   LABS:      Latest Ref Rng & Units 08/26/2021   12:00 AM  CBC  WBC  5.4      Hemoglobin 12.0 - 16.0 10.9      Hematocrit 36 - 46 33      Platelets 150 - 400 K/uL 245         This result is from an external source.      Latest Ref Rng & Units 08/26/2021   12:00 AM  CMP  BUN 4 - 21 26      Creatinine 0.5 - 1.1 1.0       Sodium 137 - 147 141      Potassium 3.5 - 5.1 mEq/L 3.9      Chloride 99 - 108 109      CO2 13 - 22 21  Calcium 8.7 - 10.7 8.7      Alkaline Phos 25 - 125 72      AST 13 - 35 35      ALT 7 - 35 U/L 25         This result is from an external source.     Lab Results  Component Value Date   CEA1 3.6 08/26/2021   /  CEA  Date Value Ref Range Status  08/26/2021 3.6 0.0 - 4.7 ng/mL Final    Comment:    (NOTE)                             Nonsmokers          <3.9                             Smokers             <5.6 Roche [employer]ostics Electrochemiluminescence Immunoassay (ECLIA) Values obtained with different assay methods or kits cannot be used interchangeably.  Results cannot be interpreted as absolute evidence of the presence or absence of malignant disease. Performed At: [facility] [place], [place] [id number] Rush Farmer MD VO:[id number]    No results found for: "PSA1" No results found for: "[phone]" No results found for: "CAN125"  No results found for: "TOTALPROTELP", "ALBUMINELP", "A1GS", "A2GS", "BETS", "BETA2SER", "GAMS", "MSPIKE", "SPEI" No results found for: "TIBC", "FERRITIN", "IRONPCTSAT" No results found for: "LDH"  STUDIES:  No results found.

## 2021-09-15 ENCOUNTER — Other Ambulatory Visit (HOSPITAL_COMMUNITY): Payer: Self-pay

## 2021-09-15 ENCOUNTER — Other Ambulatory Visit: Payer: Self-pay

## 2021-09-15 ENCOUNTER — Other Ambulatory Visit: Payer: Self-pay | Admitting: Oncology

## 2021-09-15 ENCOUNTER — Telehealth: Payer: Self-pay | Admitting: Oncology

## 2021-09-15 DIAGNOSIS — C772 Secondary and unspecified malignant neoplasm of intra-abdominal lymph nodes: Secondary | ICD-10-CM

## 2021-09-15 MED ORDER — CAPECITABINE 500 MG PO TABS
625.0000 mg/m2 | ORAL_TABLET | Freq: Two times a day (BID) | ORAL | 5 refills | Status: DC
  Filled 2021-09-15 (×2): qty 56, 21d supply, fill #0
  Filled 2021-09-27: qty 56, 21d supply, fill #1
  Filled 2021-10-29 – 2021-10-31 (×2): qty 56, 21d supply, fill #2
  Filled 2021-11-14: qty 56, 21d supply, fill #3
  Filled 2021-12-06: qty 56, 21d supply, fill #4
  Filled 2021-12-23: qty 56, 21d supply, fill #5

## 2021-09-15 NOTE — Telephone Encounter (Signed)
Contacted pt to schedule an appt. Unable to reach via phone, voicemail was left.   RE: TREATMENT DECISION Received: Today Boyce Medici, RPH  Neva Seat; [place], [place] Are you all also able to add her as a telephone phone visit with me on this Wednesday (6/21)? It would be to go over blood pressure logs that she is writing down.   Roughly 10 days after starting would be labs and visit with Dr. Hinton Rao on the 28th or 29th.   Thanks,  Katie        Previous Messages    ----- Message -----  From: Derwood Kaplan, MD  Sent: 09/15/2021   9:24 AM EDT  To: Mellody Drown; Neva Seat; *  Subject: RE: TREATMENT DECISION                         Yes, Monday would be good.  Usually would see every 3 weeks when due to start next cycle.  But I Iike to see at about day 10 with first cycle with labs to see how she tolerated, so about 2 weeks from now  ----- Message -----  From: Boyce Medici, [place]: 09/15/2021   8:54 AM EDT  To: Derwood Kaplan, MD; Marvia Pickles, PA-C  Subject: RE: TREATMENT DECISION                         Good morning!  Prior authorization has been approved. When would you like her to start? I would like to see what her blood pressure runs these next few days if possible. What are your thoughts of her starting on Monday?   Thanks,  Katie  ----- Message -----  From: Derwood Kaplan, MD  Sent: 09/14/2021   9:54 AM EDT  To: Marvia Pickles, PA-C; Boyce Medici, RPH  Subject: RE: TREATMENT DECISION                         Orders in care plan, will enter Rx but decreased to 625 mg/m2 due to age over 6  ----- Message -----  From: Marvia Pickles, PA-C  Sent: 09/13/2021   1:26 PM EDT  To: Derwood Kaplan, MD  Subject: FW: TREATMENT DECISION                         She states she would like to go ahead with capecitibine. I scheduled her with Dorian Furnace and let her know if you could put the order in. Thank you  -----  Message -----  From: Velora Heckler, CMA  Sent: 09/12/2021   1:17 PM EDT  To: Marvia Pickles, PA-C  Subject: TREATMENT DECISION                             PATIENT IS REQUESTING CALL BACK FROM YOU.  [phone].  THANKS

## 2021-09-15 NOTE — Telephone Encounter (Signed)
Oral Oncology Patient Advocate Encounter  Prior Authorization for Capecitabine '500mg'$  has been approved.    PA# (KeyMarland Kitchen [id number]) - [id number] Effective dates: 09/14/21 through 04/02/22  Patients co-pay is $11.35  Oral Oncology Clinic will continue to follow.

## 2021-09-15 NOTE — Telephone Encounter (Signed)
Patient's shipment set up from [facility].

## 2021-09-19 ENCOUNTER — Telehealth: Payer: Self-pay | Admitting: Oncology

## 2021-09-19 NOTE — Telephone Encounter (Signed)
Contacted pt to schedule appts. Unable to reach via phone, voicemail was left.   RE: TREATMENT DECISION Received: 4 days ago Boyce Medici, [place]  Neva Seat; [place], [place] Are you all also able to add her as a telephone phone visit with me on this Wednesday (6/21)? It would be to go over blood pressure logs that she is writing down.   Roughly 10 days after starting would be labs and visit with Dr. Hinton Rao on the 28th or 29th.   Thanks,  Katie        Previous Messages    ----- Message -----  From: Derwood Kaplan, MD  Sent: 09/15/2021   9:24 AM EDT  To: Mellody Drown; Neva Seat; *  Subject: RE: TREATMENT DECISION                         Yes, Monday would be good.  Usually would see every 3 weeks when due to start next cycle.  But I Iike to see at about day 10 with first cycle with labs to see how she tolerated, so about 2 weeks from now  ----- Message -----  From: Boyce Medici, [place]: 09/15/2021   8:54 AM EDT  To: Derwood Kaplan, MD; Marvia Pickles, PA-C  Subject: RE: TREATMENT DECISION                         Good morning!  Prior authorization has been approved. When would you like her to start? I would like to see what her blood pressure runs these next few days if possible. What are your thoughts of her starting on Monday?   Thanks,  Katie  ----- Message -----  From: Derwood Kaplan, MD  Sent: 09/14/2021   9:54 AM EDT  To: Marvia Pickles, PA-C; Boyce Medici, RPH  Subject: RE: TREATMENT DECISION                         Orders in care plan, will enter Rx but decreased to 625 mg/m2 due to age over 28  ----- Message -----  From: Marvia Pickles, PA-C  Sent: 09/13/2021   1:26 PM EDT  To: Derwood Kaplan, MD  Subject: FW: TREATMENT DECISION                         She states she would like to go ahead with capecitibine. I scheduled her with Dorian Furnace and let her know if you could put the order in. Thank you  -----  Message -----  From: Velora Heckler, CMA  Sent: 09/12/2021   1:17 PM EDT  To: Marvia Pickles, PA-C  Subject: TREATMENT DECISION                             PATIENT IS REQUESTING CALL BACK FROM YOU.  [phone].  THANKS          Comments  09/15/21 - LVM for patient to call the office to schedule.

## 2021-09-23 ENCOUNTER — Telehealth: Payer: Self-pay | Admitting: Oncology

## 2021-09-27 ENCOUNTER — Other Ambulatory Visit (HOSPITAL_COMMUNITY): Payer: Self-pay

## 2021-09-29 ENCOUNTER — Other Ambulatory Visit: Payer: Self-pay | Admitting: Oncology

## 2021-09-29 ENCOUNTER — Inpatient Hospital Stay: Payer: PPO

## 2021-09-29 ENCOUNTER — Other Ambulatory Visit: Payer: Self-pay | Admitting: Hematology and Oncology

## 2021-09-29 ENCOUNTER — Inpatient Hospital Stay (INDEPENDENT_AMBULATORY_CARE_PROVIDER_SITE_OTHER): Payer: PPO | Admitting: Oncology

## 2021-09-29 ENCOUNTER — Encounter: Payer: Self-pay | Admitting: Oncology

## 2021-09-29 VITALS — BP 139/65 | HR 54 | Temp 97.5°F | Resp 17 | Ht 61.7 in | Wt 138.3 lb

## 2021-09-29 DIAGNOSIS — C186 Malignant neoplasm of descending colon: Secondary | ICD-10-CM

## 2021-09-29 DIAGNOSIS — D649 Anemia, unspecified: Secondary | ICD-10-CM | POA: Diagnosis not present

## 2021-09-29 DIAGNOSIS — D500 Iron deficiency anemia secondary to blood loss (chronic): Secondary | ICD-10-CM | POA: Diagnosis not present

## 2021-09-29 DIAGNOSIS — C772 Secondary and unspecified malignant neoplasm of intra-abdominal lymph nodes: Secondary | ICD-10-CM | POA: Diagnosis not present

## 2021-09-29 LAB — BASIC METABOLIC PANEL
BUN: 14 (ref 4–21)
CO2: 26 — AB (ref 13–22)
Chloride: 109 — AB (ref 99–108)
Creatinine: 1 (ref 0.5–1.1)
Glucose: 94
Potassium: 3.6 mEq/L (ref 3.5–5.1)
Sodium: 141 (ref 137–147)

## 2021-09-29 LAB — CBC AND DIFFERENTIAL
HCT: 33 — AB (ref 36–46)
Hemoglobin: 10.9 — AB (ref 12.0–16.0)
Neutrophils Absolute: 4.96
Platelets: 238 10*3/uL (ref 150–400)
WBC: 6.8

## 2021-09-29 LAB — HEPATIC FUNCTION PANEL
ALT: 25 U/L (ref 7–35)
AST: 39 — AB (ref 13–35)
Alkaline Phosphatase: 56 (ref 25–125)
Bilirubin, Total: 0.6

## 2021-09-29 LAB — CBC: RBC: 3.65 — AB (ref 3.87–5.11)

## 2021-09-29 LAB — COMPREHENSIVE METABOLIC PANEL
Albumin: 3.8 (ref 3.5–5.0)
Calcium: 9.2 (ref 8.7–10.7)

## 2021-09-30 ENCOUNTER — Encounter: Payer: Self-pay | Admitting: Oncology

## 2021-09-30 ENCOUNTER — Telehealth: Payer: Self-pay

## 2021-09-30 NOTE — Telephone Encounter (Signed)
I spoke with pt's son. He states pt is "doing well. No problems that we have seen so far". She takes her medication after meals, usually 730a-8a, and 7-8 pm. No missed doses. No fevers, mouth sores, SOB, cough, N/V, diarrhea, and rash/itching. I reminded him to call us if she develops temp of 100.4 or higher. He verbalized understanding and stated "we have all that on our sheets here". I confirmed next appt.

## 2021-10-04 ENCOUNTER — Encounter: Payer: Self-pay | Admitting: Oncology

## 2021-10-05 ENCOUNTER — Other Ambulatory Visit (HOSPITAL_COMMUNITY): Payer: Self-pay

## 2021-10-06 ENCOUNTER — Other Ambulatory Visit (HOSPITAL_COMMUNITY): Payer: Self-pay

## 2021-10-07 DIAGNOSIS — Z139 Encounter for screening, unspecified: Secondary | ICD-10-CM | POA: Diagnosis not present

## 2021-10-07 DIAGNOSIS — Z0000 Encounter for general adult medical examination without abnormal findings: Secondary | ICD-10-CM | POA: Diagnosis not present

## 2021-10-07 DIAGNOSIS — E785 Hyperlipidemia, unspecified: Secondary | ICD-10-CM | POA: Diagnosis not present

## 2021-10-07 DIAGNOSIS — Z1331 Encounter for screening for depression: Secondary | ICD-10-CM | POA: Diagnosis not present

## 2021-10-07 DIAGNOSIS — Z9181 History of falling: Secondary | ICD-10-CM | POA: Diagnosis not present

## 2021-10-12 ENCOUNTER — Telehealth: Payer: Self-pay

## 2021-10-12 NOTE — Telephone Encounter (Signed)
I spoke with pt's spouse,Don. He states, "She's doing great. No side effects. She started the 2nd cycle on Monday, 10/10/20".  No missed doses. She still takes the Xeloda between 7a-8a, and 7p-8p nightly after meals. No fevers, N/V, mouth sores, diarrhea, skin reactions and itching. "She takes her blood pressure every morning and it is staying steady". I asked him to bring the BP log with them at next appt. We confirmed next appt 10/20/2021 @ 1030. I reminded him to call us if she develops temp of 100.4 or higher,day or night. He verbalized understanding.

## 2021-10-13 ENCOUNTER — Other Ambulatory Visit (HOSPITAL_COMMUNITY): Payer: Self-pay

## 2021-10-19 ENCOUNTER — Other Ambulatory Visit (HOSPITAL_COMMUNITY): Payer: Self-pay

## 2021-10-20 ENCOUNTER — Encounter: Payer: Self-pay | Admitting: Oncology

## 2021-10-20 ENCOUNTER — Inpatient Hospital Stay: Payer: PPO | Attending: Oncology | Admitting: Oncology

## 2021-10-20 ENCOUNTER — Telehealth: Payer: Self-pay

## 2021-10-20 ENCOUNTER — Inpatient Hospital Stay: Payer: PPO

## 2021-10-20 VITALS — BP 155/67 | HR 53 | Temp 97.6°F | Resp 19 | Ht 61.7 in | Wt 139.9 lb

## 2021-10-20 DIAGNOSIS — C772 Secondary and unspecified malignant neoplasm of intra-abdominal lymph nodes: Secondary | ICD-10-CM

## 2021-10-20 DIAGNOSIS — C186 Malignant neoplasm of descending colon: Secondary | ICD-10-CM

## 2021-10-20 DIAGNOSIS — D649 Anemia, unspecified: Secondary | ICD-10-CM | POA: Diagnosis not present

## 2021-10-20 LAB — CBC AND DIFFERENTIAL
HCT: 32 — AB (ref 36–46)
Hemoglobin: 10.6 — AB (ref 12.0–16.0)
Neutrophils Absolute: 3.23
Platelets: 186 10*3/uL (ref 150–400)
WBC: 5.3

## 2021-10-20 LAB — COMPREHENSIVE METABOLIC PANEL
Albumin: 3.9 (ref 3.5–5.0)
Calcium: 9.3 (ref 8.7–10.7)

## 2021-10-20 LAB — HEPATIC FUNCTION PANEL
ALT: 25 U/L (ref 7–35)
AST: 40 — AB (ref 13–35)
Alkaline Phosphatase: 64 (ref 25–125)
Bilirubin, Total: 0.6

## 2021-10-20 LAB — BASIC METABOLIC PANEL
BUN: 24 — AB (ref 4–21)
CO2: 23 — AB (ref 13–22)
Chloride: 111 — AB (ref 99–108)
Creatinine: 1.1 (ref 0.5–1.1)
Glucose: 92
Potassium: 4.1 mEq/L (ref 3.5–5.1)
Sodium: 141 (ref 137–147)

## 2021-10-20 LAB — CBC: RBC: 3.49 — AB (ref 3.87–5.11)

## 2021-10-20 NOTE — Progress Notes (Signed)
[facility]  [address] [place],  [place]  [address] [phone]  Clinic Day:  10/20/21  Referring physician: Lowella Dandy, NP   ASSESSMENT & PLAN:   Adenocarcinoma of the descending colon This is a signet ring cell poorly differentiated lesion with lymphovascular invasion and penetration of the bowel wall.  This is a T3N2BM0, stage IIIc, with 12 of 32 nodes positive I explained the poor prognosis with this stage of disease and my recommendation for chemotherapy.  I understand she is 81 years old but she has a good performance status and so I do recommend adjuvant treatment.  We discussed the options of FOLFOX versus 5-FU alone leucovorin versus oral capecitabine.  I would recommend this for 6 months. She agreed to proceed with this and started her first cycle the first week of July 2023.  She has tolerated this very well.  Iron deficiency anemia I recommend that she continue oral iron supplement for another 3 months to replace her iron stores.   She will come in October 31, 2021 to complete her labs, CBC and CMP. I instructed her to not start her 2nd cycle of pills until after receiving those results.We will plan to schedule her in 1 month with CBC and CMP before she starts her 3rd cycle. I did recommend that she stay on oral iron supplement for another 3 months and also that she increase her fluids. The patient was provided an opportunity to ask questions and all were answered.  The patient agreed with the plan and demonstrated an understanding of the instructions.  The patient was advised to call back if the symptoms worsen or if the condition fails to improve as anticipated.    I provided 20 minutes of face-to-face time during this this encounter and > 50% was spent counseling as documented under my assessment and plan.    Derwood Kaplan, MD [facility] [address] [place]  [place] [address] Dept: [phone] Dept Fax: [phone]   CHIEF COMPLAINT:  CC: Adenocarcinoma of the descending colon  Current Treatment: Recommendations for chemotherapy   HISTORY OF PRESENT ILLNESS:  Jenna Hutchinson is a 81 y.o. female with a history of newly diagnosed colon cancer who is referred in consultation with Dr. Orrin Brigham for assessment and management.  She presented with iron deficiency anemia and this normalized after oral iron supplement for 4 weeks.  She was referred to Dr. Nehemiah Settle for colonoscopy.  She does have a history of a colon polyp and her last colonoscopy was 15 years ago.  This revealed advanced diverticulosis of the sigmoid colon associated with angulation, spasm and muscular hypertrophy, requiring a pediatric scope.  She was found to have a malignant mass of the descending colon but this could not be passed with the scope.  Pathology revealed an invasive poorly differentiated adenocarcinoma arising in an adenoma with high-grade dysplasia.  Because of the limitations of the colonoscopy, she had a virtual colonography on April 12.  This confirmed an apple core lesion with irregular narrowing of the lumen at the splenic flexure, measuring approximately 4 cm.  She also had a sclerotic area of the left iliac bone which has been stable since 2014, and extensive diverticulosis.  She was referred to the surgeon and on May 11 had a left hemicolectomy.  Final pathology revealed a signet ring adenocarcinoma which was poorly cohesive and arising in a tubular adenoma with high-grade  dysplasia.  This was a 6.3 cm grade 3 lesion which penetrated the bowel wall and had lymphovascular invasion.  12 of 32 nodes were positive for metastasis, for a T3 N2b M0, stage IIIC adenocarcinoma of the colon.  MMR was normal and MSI stable.  She is recovering well from the surgery and did not realize that she might need additional treatment.  INTERVAL HISTORY:  [name] is here today for a routine  follow up. She is managing her pills fairly well. She will be done with her first cycle of Xeloda on Sunday, then will have a week off. We will check her blood right before she starts her next cycle of pills.(July 31 st) She reports her bowels are feeling normal and she is having more solid movements. Her skin is doing well.Her appetite is good.  She has lost 1 pound since her last visit. Wt Readings from Last 3 Encounters:  10/20/21 139 lb 14.4 oz (63.5 kg)  09/29/21 138 lb 4.8 oz (62.7 kg)  08/26/21 139 lb 4.8 oz (63.2 kg)  Her blood pressure was slightly elevated today. She has been keeping track of her readings and they have been running good at home. BP Readings from Last 3 Encounters:  10/20/21 (!) 155/67  09/29/21 139/65  08/26/21 (!) 143/65   Her hemoglobin was 10.9 and now 10.6 which has remained stable. Her blood work is otherwise normal. She continues to take her iron supplements. She denies nausea, vomiting or bowel problems.  She denies cough, wheezing or dyspnea.  She denies chest pain, fevers or chills.  She denies any skin problems  I have reviewed her chart and materials related to her cancer extensively and collaborated history with the patient. Summary of oncologic history is as follows: Oncology History  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 ([place])  08/11/2021 Cancer Staging   Staging form: Colon and Rectum, AJCC 8th Edition - Clinical stage from 08/11/2021: Stage IIIC (cT3, cN2b, cM0) - Signed by Derwood Kaplan, MD on 09/14/2021 Histopathologic type: Adenocarcinoma, NOS Stage prefix: Initial diagnosis Total positive nodes: 12 Total nodes examined: 32 Histologic grade (G): G3 Histologic grading system: 4 grade system Laterality: Left Tumor size (mm): 4 Lymph-vascular invasion (LVI): LVI present/identified, NOS Diagnostic confirmation: Positive histology Specimen type: Excision Staged by: Managing physician Tumor deposits (TD):  Absent Carcinoembryonic antigen (CEA) (ng/mL): 3.6 Perineural invasion (PNI): Unknown Microsatellite instability (MSI): Stable KRAS mutation: Not assessed NRAS mutation: Not assessed BRAF mutation: Not assessed Stage used in treatment planning: Yes National guidelines used in treatment planning: Yes Type of national guideline used in treatment planning: NCCN Staging comments: Over 27 years old, will use capecitabine at reduced dose as tolerated   08/25/2021 Initial Diagnosis   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 ([place])   09/21/2021 - 09/21/2021 Chemotherapy   Patient is on Treatment Plan : COLORECTAL Capecitabine q21d       HISTORY:   Past Medical History:  Diagnosis Date   Arthritis    HTN (hypertension)    Hypothyroidism    Osteoporosis   History of myocardial infarction in 2017, with placement of 4 stents  Past Surgical History:  Procedure Laterality Date   BLADDER SURGERY     COLON SURGERY     eyelid surgery    Right lumpectomy in 1983 for a benign lesion  Family History  Problem Relation Age of Onset   Colon cancer Mother    Prostate cancer Brother  Social History:  reports that she has never smoked. She has never used smokeless tobacco. She reports that she does not drink alcohol and does not use drugs.The patient is accompanied by her husband today.  They have no children.  She continues to work in [employer].  She denies chemical exposures.  Allergies: No Known Allergies  Current Medications: Current Outpatient Medications  Medication Sig Dispense Refill   aspirin 81 MG chewable tablet Chew by mouth.     capecitabine (XELODA) 500 MG tablet Take 2 tablets (1,000 mg total) by mouth 2 (two) times daily after a meal. Take for 14 days on, 7 days off. Repeat every 21 days. 56 tablet 5   carvedilol (COREG) 3.125 MG tablet Take 3.125 mg by mouth 2 (two) times daily.     Cholecalciferol 25 MCG (1000 UT) capsule Take by mouth.      fluticasone (FLONASE) 50 MCG/ACT nasal spray Place 2 sprays into both nostrils daily.     furosemide (LASIX) 20 MG tablet Take 20 mg by mouth daily.     hydroxychloroquine (PLAQUENIL) 200 MG tablet Take 200 mg by mouth 2 (two) times daily.     losartan (COZAAR) 25 MG tablet Take 25 mg by mouth daily.     nitroGLYCERIN (NITROSTAT) 0.4 MG SL tablet DISSOLVE 1 TABLET UNDER THE TONGUE EVERY 5 MINUTES AS NEEDED FOR CHEST PAIN. CALL DOCTOR/ 911 IF TAKE 2 DOSES. MAX 3 TABLETS PER DAY     predniSONE (DELTASONE) 10 MG tablet Take 10 mg by mouth daily as needed.     PROLIA 60 MG/ML SOSY injection Inject into the skin.     rosuvastatin (CRESTOR) 20 MG tablet Take 1 tablet by mouth daily.     SYNTHROID 125 MCG tablet Take 125 mcg by mouth daily.     No current facility-administered medications for this visit.    REVIEW OF SYSTEMS:  Review of Systems  Constitutional: Negative.   HENT:  Negative.    Eyes: Negative.   Respiratory: Negative.    Cardiovascular: Negative.   Gastrointestinal:  Positive for abdominal pain.  Endocrine: Negative.   Genitourinary: Negative.    Musculoskeletal: Negative.   Skin: Negative.   Neurological: Negative.   Hematological: Negative.   Psychiatric/Behavioral: Negative.        VITALS:  Blood pressure (!) 155/67, pulse (!) 53, temperature 97.6 F (36.4 C), temperature source Oral, resp. rate 19, height 5' 1.7" (1.567 m), weight 139 lb 14.4 oz (63.5 kg), SpO2 98 %.  Wt Readings from Last 3 Encounters:  10/20/21 139 lb 14.4 oz (63.5 kg)  09/29/21 138 lb 4.8 oz (62.7 kg)  08/26/21 139 lb 4.8 oz (63.2 kg)    Body mass index is 25.84 kg/m.  Performance status (ECOG): 1 - Symptomatic but completely ambulatory  PHYSICAL EXAM:  Physical Exam Constitutional:      General: She is not in acute distress.    Appearance: Normal appearance. She is normal weight.  HENT:     Head: Normocephalic and atraumatic.  Eyes:     General: No scleral icterus.    Extraocular  Movements: Extraocular movements intact.     Conjunctiva/sclera: Conjunctivae normal.     Pupils: Pupils are equal, round, and reactive to light.  Cardiovascular:     Rate and Rhythm: Normal rate and regular rhythm.     Pulses: Normal pulses.     Heart sounds: Normal heart sounds. No murmur heard.    No friction rub. No gallop.  Pulmonary:  Effort: Pulmonary effort is normal. No respiratory distress.     Breath sounds: Normal breath sounds.  Abdominal:     General: Bowel sounds are normal. There is no distension.     Palpations: Abdomen is soft. There is no hepatomegaly, splenomegaly or mass.     Tenderness: There is no abdominal tenderness.  Musculoskeletal:        General: Normal range of motion.     Cervical back: Normal range of motion and neck supple.  Lymphadenopathy:     Cervical: No cervical adenopathy.  Skin:    General: Skin is warm and dry.  Neurological:     General: No focal deficit present.     Mental Status: She is alert and oriented to person, place, and time. Mental status is at baseline.  Psychiatric:        Mood and Affect: Mood normal.        Behavior: Behavior normal.        Thought Content: Thought content normal.        Judgment: Judgment normal.      LABS:      Latest Ref Rng & Units 10/31/2021   12:00 AM 10/20/2021   12:00 AM 09/29/2021   12:00 AM  CBC  WBC  6.4     5.3     6.8      Hemoglobin 12.0 - 16.0 10.5     10.6     10.9      Hematocrit 36 - 46 32     32     33      Platelets 150 - 400 K/uL 244     186     238         This result is from an external source.      Latest Ref Rng & Units 10/31/2021   12:00 AM 10/20/2021   12:00 AM 09/29/2021   12:00 AM  CMP  BUN 4 - 21 21     24     14       Creatinine 0.5 - 1.1 1.1     1.1     1.0      Sodium 137 - 147 139     141     141      Potassium 3.5 - 5.1 mEq/L 3.8     4.1     3.6      Chloride 99 - 108 110     111     109      CO2 13 - 22 22     23     26       Calcium 8.7 - 10.7 9.2      9.3     9.2      Alkaline Phos 25 - 125 66     64     56      AST 13 - 35 37     40     39      ALT 7 - 35 U/L 22     25     25          This result is from an external source.     Lab Results  Component Value Date   CEA1 3.6 08/26/2021   /  CEA  Date Value Ref Range Status  08/26/2021 3.6 0.0 - 4.7 ng/mL Final    Comment:    (NOTE)  Nonsmokers          <3.9                             Smokers             <5.6 Roche [employer] Electrochemiluminescence Immunoassay (ECLIA) Values obtained with different assay methods or kits cannot be used interchangeably.  Results cannot be interpreted as absolute evidence of the presence or absence of malignant disease. Performed At: [facility] [place], [place] [id number] Rush Farmer MD EH:[id number]    No results found for: "PSA1" No results found for: "[id number]" No results found for: "CAN125"  No results found for: "TOTALPROTELP", "ALBUMINELP", "A1GS", "A2GS", "BETS", "BETA2SER", "GAMS", "MSPIKE", "SPEI" No results found for: "TIBC", "FERRITIN", "IRONPCTSAT" No results found for: "LDH"  STUDIES:  No results found.  EXAM:07/14/2021 CT VIRTUAL COLONSCOPY DIAGNOSITC FINDINGS: VIRTUAL COLONOSCOPY  There is an area of apple-core like irregular narrowing of the lumen at the splenic flexure extending over approximately a 4 cm segment, seen best on coronal image 29, series 11 and from axial image 77, series 09/02 axial image ninety-one of series 9 concerning for malignancy. Extensive diverticulosis throughout the colon. Area of under distension noted in the mid sigmoid colon which could be related to diverticular disease or annular constricting lesion/mass. No other fixed annual lesions or polypoid filling defects.  Virtual colonoscopy is not designed to detect diminutive polyps (i.e., less than or equal to 5 mm), the presence or absence of which may not affect clinical  management.  CT ABDOMEN AND PELVIS WITHOUT CONTRAST  Lower chest: No acute findings.  Hepatobiliary: No focal hepatic abnormality. Gallbladder unremarkable.  Pancreas: No focal abnormality or ductal dilatation.  Spleen: No focal abnormality. Normal size.  Adrenals/Urinary Tract: No adrenal abnormality. No focal renal abnormality. No stones or hydronephrosis. Urinary bladder is unremarkable.  Stomach/Bowel: Stomach and small bowel decompressed, grossly unremarkable.  Vascular/Lymphatic: Aortoiliac atherosclerosis. No evidence of aneurysm or adenopathy.  Reproductive: Prior hysterectomy. No adnexal masses.  Other: No free fluid or free air.  Musculoskeletal: Sclerotic area within the left iliac bone was present on prior imaging from 2014, likely reflecting bone island. Degenerative changes in the lumbar spine.  IMPRESSION: Suspicious masslike irregular apple-core lesion at the splenic flexure over a 4 cm segment concerning for colon cancer.  Area of under distension in the mid sigmoid colon which could be related to diverticular disease although annular lesion cannot be excluded. Recommend correlation with colonoscopy.  Diffuse colonic diverticulosis.  Aortic atherosclerosis.     I,Gabriella Ballesteros,acting as a scribe for Derwood Kaplan, MD.,have documented all relevant documentation on the behalf of Derwood Kaplan, MD,as directed by  Derwood Kaplan, MD while in the presence of Derwood Kaplan, MD.

## 2021-10-20 NOTE — Telephone Encounter (Signed)
I spoke with pt while here in clinic today. Pt states she is taking her medication in the morning and @ night after meals. She denies missed doses. Pt denies fever, mouth sores, SOB, N/V, diarrhea, and skin reactions/irritations/rashes to hands and feet. She states she is applying moisturizing lotion daily. I reminded her to call us if she develops temp of 100.4 or higher, day or night. She verbalized understanding.

## 2021-10-21 ENCOUNTER — Other Ambulatory Visit (HOSPITAL_COMMUNITY): Payer: Self-pay

## 2021-10-27 NOTE — Progress Notes (Signed)
[name]  [address] [place],  [place]  [address] [phone]  Clinic Day:  08/26/21  Referring physician: Pollyann Samples, MD   ASSESSMENT & PLAN:   Adenocarcinoma of the descending colon This is a signet ring cell poorly differentiated lesion with lymphovascular invasion and penetration of the bowel wall.  This is a T3N2BM0, stage IIIc, with 12 of 32 nodes positive I explained the poor prognosis with this stage of disease and my recommendation for chemotherapy.  I understand she is 81 years old but she has a good performance status and so I do recommend adjuvant treatment.  We discussed the options of FOLFOX versus 5-FU alone leucovorin versus oral capecitabine.  I would recommend this for 6 months.  After thinking it over, the patient did decide to proceed with oral capecitabine, and she is tolerating it well.  Iron deficiency anemia I recommend that she resume oral iron supplement for another 3 months to replace her iron stores.  Her anemia is stable with a hemoglobin of 10.9.   She is finishing up her 2-week cycle of capecitabine and will have 1 week off and then resume treatment for the next cycle.  I did recommend that she resume oral iron supplement.  I have recommended that she elevate her feet for the edema.  I am concerned about getting her dehydrated with diuretics and she is already having some diarrhea.  I will see her back in 3 weeks as she is finishing up her next cycle with CBC and CMP.  I did recommend that she increase her fluids.  I discussed the treatment plan with the patient.  The patient was provided an opportunity to ask questions and all were answered.  The patient agreed with the plan and demonstrated an understanding of the instructions.  The patient was advised to call back if the symptoms worsen or if the condition fails to improve as anticipated.   I provided 20 minutes of face-to-face time during this this encounter and >  50% was spent counseling as documented under my assessment and plan.    Derwood Kaplan, MD [facility] [address] [place] [address] Dept: [phone] Dept Fax: [phone]   CHIEF COMPLAINT:  CC: Adenocarcinoma of the descending colon  Current Treatment: Recommendations for chemotherapy   HISTORY OF PRESENT ILLNESS:  Jenna Hutchinson is a 81 y.o. female with a history of newly diagnosed colon cancer who is referred in consultation with Dr. Orrin Brigham for assessment and management.  She presented with iron deficiency anemia and this normalized after oral iron supplement for 4 weeks.  She was referred to Dr. Nehemiah Settle for colonoscopy.  She does have a history of a colon polyp and her last colonoscopy was 15 years ago.  This revealed advanced diverticulosis of the sigmoid colon associated with angulation, spasm and muscular hypertrophy, requiring a pediatric scope.  She was found to have a malignant mass of the descending colon but this could not be passed with the scope.  Pathology revealed an invasive poorly differentiated adenocarcinoma arising in an adenoma with high-grade dysplasia.  Because of the limitations of the colonoscopy, she had a virtual colonography on April 12.  This confirmed an apple core lesion with irregular narrowing of the lumen at the splenic flexure, measuring approximately 4 cm.  She also had a sclerotic area of the left iliac bone which has been stable since 2014,  and extensive diverticulosis.  She was referred to the surgeon and on May 11 had a left hemicolectomy.  Final pathology revealed a signet ring adenocarcinoma which was poorly cohesive and arising in a tubular adenoma with high-grade dysplasia.  This was a 6.3 cm grade 3 lesion which penetrated the bowel wall and had lymphovascular invasion.  12 of 32 nodes were positive for metastasis, for a T3 N2b M0, stage IIIC adenocarcinoma of  the colon.  MMR was normal and MSI stable.  She is recovering well from the surgery and did not realize that she might need additional treatment.  INTERVAL HISTORY:  Her abdomen is sore from the surgery but she is otherwise doing well.  She does complain of increased gas.  Her appetite is good.  She still has a pedal edema.  She denies nausea or vomiting, but is having some diarrhea. She denies cough, wheezing or dyspnea.  She denies chest pain, fevers or chills.  CBC today reveals a stable hemoglobin of 10.9 with an MCV of 89 and normal white count, normal platelet count.  Comprehensive metabolic profile is unremarkable.  I have reviewed her chart and materials related to her cancer extensively and collaborated history with the patient. Summary of oncologic history is as follows: Oncology History  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 ([facility])  08/11/2021 Cancer Staging   Staging form: Colon and Rectum, AJCC 8th Edition - Clinical stage from 08/11/2021: Stage IIIC (cT3, cN2b, cM0) - Signed by Derwood Kaplan, MD on 09/14/2021 Histopathologic type: Adenocarcinoma, NOS Stage prefix: Initial diagnosis Total positive nodes: 12 Total nodes examined: 32 Histologic grade (G): G3 Histologic grading system: 4 grade system Laterality: Left Tumor size (mm): 36 Lymph-vascular invasion (LVI): LVI present/identified, NOS Diagnostic confirmation: Positive histology Specimen type: Excision Staged by: Managing physician Tumor deposits (TD): Absent Carcinoembryonic antigen (CEA) (ng/mL): 3.6 Perineural invasion (PNI): Unknown Microsatellite instability (MSI): Stable KRAS mutation: Not assessed NRAS mutation: Not assessed BRAF mutation: Not assessed Stage used in treatment planning: Yes National guidelines used in treatment planning: Yes Type of national guideline used in treatment planning: NCCN Staging comments: Over 39 years old, will use capecitabine at reduced dose as  tolerated   08/25/2021 Initial Diagnosis   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 ([facility])   09/21/2021 - 09/21/2021 Chemotherapy   Patient is on Treatment Plan : COLORECTAL Capecitabine q21d       HISTORY:   Past Medical History:  Diagnosis Date   Arthritis    HTN (hypertension)    Hypothyroidism    Osteoporosis   History of myocardial infarction in 2017, with placement of 4 stents  Past Surgical History:  Procedure Laterality Date   BLADDER SURGERY     COLON SURGERY     eyelid surgery    Right lumpectomy in 1983 for a benign lesion  Family History  Problem Relation Age of Onset   Colon cancer Mother    Prostate cancer Brother     Social History:  reports that she has never smoked. She has never used smokeless tobacco. She reports that she does not drink alcohol and does not use drugs.The patient is accompanied by her husband today.  They have no children.  She continues to work in [employer].  She denies chemical exposures.  Allergies: No Known Allergies  Current Medications: Current Outpatient Medications  Medication Sig Dispense Refill   aspirin 81 MG chewable tablet Chew by mouth.     capecitabine (XELODA) 500  MG tablet Take 2 tablets (1,000 mg total) by mouth 2 (two) times daily after a meal. Take for 14 days on, 7 days off. Repeat every 21 days. 56 tablet 5   carvedilol (COREG) 3.125 MG tablet Take 3.125 mg by mouth 2 (two) times daily.     Cholecalciferol 25 MCG (1000 UT) capsule Take by mouth.     fluticasone (FLONASE) 50 MCG/ACT nasal spray Place 2 sprays into both nostrils daily.     furosemide (LASIX) 20 MG tablet Take 20 mg by mouth daily.     hydroxychloroquine (PLAQUENIL) 200 MG tablet Take 200 mg by mouth 2 (two) times daily.     losartan (COZAAR) 25 MG tablet Take 25 mg by mouth daily.     nitroGLYCERIN (NITROSTAT) 0.4 MG SL tablet DISSOLVE 1 TABLET UNDER THE TONGUE EVERY 5 MINUTES AS NEEDED FOR CHEST PAIN. CALL DOCTOR/ 911 IF  TAKE 2 DOSES. MAX 3 TABLETS PER DAY     predniSONE (DELTASONE) 10 MG tablet Take 10 mg by mouth daily as needed.     PROLIA 60 MG/ML SOSY injection Inject into the skin.     rosuvastatin (CRESTOR) 20 MG tablet Take 1 tablet by mouth daily.     SYNTHROID 125 MCG tablet Take 125 mcg by mouth daily.     No current facility-administered medications for this visit.    REVIEW OF SYSTEMS:  Review of Systems  Constitutional: Negative.   HENT:  Negative.    Eyes: Negative.   Respiratory: Negative.    Cardiovascular: Negative.   Gastrointestinal:  Negative for abdominal pain.  Endocrine: Negative.   Genitourinary: Negative.    Musculoskeletal: Negative.   Skin: Negative.   Neurological: Negative.   Hematological: Negative.   Psychiatric/Behavioral: Negative.       VITALS:  Blood pressure 139/65, pulse (!) 54, temperature (!) 97.5 F (36.4 C), temperature source Oral, resp. rate 17, height 5' 1.7" (1.567 m), weight 138 lb 4.8 oz (62.7 kg), SpO2 98 %.  Wt Readings from Last 3 Encounters:  10/20/21 139 lb 14.4 oz (63.5 kg)  09/29/21 138 lb 4.8 oz (62.7 kg)  08/26/21 139 lb 4.8 oz (63.2 kg)    Body mass index is 25.54 kg/m.  Performance status (ECOG): 1 - Symptomatic but completely ambulatory  PHYSICAL EXAM:  Physical Exam   LABS:      Latest Ref Rng & Units 10/20/2021   12:00 AM 09/29/2021   12:00 AM 08/26/2021   12:00 AM  CBC  WBC  5.3     6.8     5.4      Hemoglobin 12.0 - 16.0 10.6     10.9     10.9      Hematocrit 36 - 46 32     33     33      Platelets 150 - 400 K/uL 186     238     245         This result is from an external source.       Latest Ref Rng & Units 10/20/2021   12:00 AM 09/29/2021   12:00 AM 08/26/2021   12:00 AM  CMP  BUN 4 - 21 24     14     26       Creatinine 0.5 - 1.1 1.1     1.0     1.0      Sodium 137 - 147 141     141  141      Potassium 3.5 - 5.1 mEq/L 4.1     3.6     3.9      Chloride 99 - 108 111     109     109      CO2 13 - 22 23      26     21       Calcium 8.7 - 10.7 9.3     9.2     8.7      Alkaline Phos 25 - 125 64     56     72      AST 13 - 35 40     39     35      ALT 7 - 35 U/L 25     25     25          This result is from an external source.      Lab Results  Component Value Date   CEA1 3.6 08/26/2021   /  CEA  Date Value Ref Range Status  08/26/2021 3.6 0.0 - 4.7 ng/mL Final    Comment:    (NOTE)                             Nonsmokers          <3.9                             Smokers             <5.6 [employer] Electrochemiluminescence Immunoassay (ECLIA) Values obtained with different assay methods or kits cannot be used interchangeably.  Results cannot be interpreted as absolute evidence of the presence or absence of malignant disease. Performed At: [facility] [place], [place] [id number] Rush Farmer MD SR:[id number]    No results found for: "PSA1" No results found for: "[id number]" No results found for: "CAN125"  No results found for: "TOTALPROTELP", "ALBUMINELP", "A1GS", "A2GS", "BETS", "BETA2SER", "GAMS", "MSPIKE", "SPEI" No results found for: "TIBC", "FERRITIN", "IRONPCTSAT" No results found for: "LDH"  STUDIES:  No results found.

## 2021-10-28 ENCOUNTER — Other Ambulatory Visit: Payer: Self-pay | Admitting: Hematology and Oncology

## 2021-10-28 DIAGNOSIS — C772 Secondary and unspecified malignant neoplasm of intra-abdominal lymph nodes: Secondary | ICD-10-CM

## 2021-10-29 ENCOUNTER — Other Ambulatory Visit (HOSPITAL_COMMUNITY): Payer: Self-pay

## 2021-10-31 ENCOUNTER — Other Ambulatory Visit (HOSPITAL_COMMUNITY): Payer: Self-pay

## 2021-10-31 ENCOUNTER — Inpatient Hospital Stay: Payer: PPO

## 2021-10-31 DIAGNOSIS — C772 Secondary and unspecified malignant neoplasm of intra-abdominal lymph nodes: Secondary | ICD-10-CM

## 2021-10-31 DIAGNOSIS — C186 Malignant neoplasm of descending colon: Secondary | ICD-10-CM | POA: Diagnosis not present

## 2021-10-31 LAB — HEPATIC FUNCTION PANEL
ALT: 22 U/L (ref 7–35)
AST: 37 — AB (ref 13–35)
Alkaline Phosphatase: 66 (ref 25–125)
Bilirubin, Total: 0.6

## 2021-10-31 LAB — BASIC METABOLIC PANEL
BUN: 21 (ref 4–21)
CO2: 22 (ref 13–22)
Chloride: 110 — AB (ref 99–108)
Creatinine: 1.1 (ref 0.5–1.1)
Glucose: 91
Potassium: 3.8 mEq/L (ref 3.5–5.1)
Sodium: 139 (ref 137–147)

## 2021-10-31 LAB — CBC: RBC: 3.44 — AB (ref 3.87–5.11)

## 2021-10-31 LAB — COMPREHENSIVE METABOLIC PANEL
Albumin: 3.7 (ref 3.5–5.0)
Calcium: 9.2 (ref 8.7–10.7)

## 2021-10-31 LAB — CBC AND DIFFERENTIAL
HCT: 32 — AB (ref 36–46)
Hemoglobin: 10.5 — AB (ref 12.0–16.0)
Neutrophils Absolute: 4.54
Platelets: 244 10*3/uL (ref 150–400)
WBC: 6.4

## 2021-11-02 ENCOUNTER — Other Ambulatory Visit (HOSPITAL_COMMUNITY): Payer: Self-pay

## 2021-11-04 ENCOUNTER — Telehealth: Payer: Self-pay

## 2021-11-04 NOTE — Telephone Encounter (Signed)
I spoke with Timmothy Sours as Jenna Hutchinson wasn't @ home. He states she continues to be doing great. No missed doses. No N/V, mouth sores,diarrhea,  skin rash/itching, and fevers. They check her blood pressure every morning and its good. I reminded him of the importance in calling us if she develops temp of 100.4 or higher, day or night. He verbalized understanding.

## 2021-11-11 ENCOUNTER — Other Ambulatory Visit (HOSPITAL_COMMUNITY): Payer: Self-pay

## 2021-11-14 ENCOUNTER — Other Ambulatory Visit (HOSPITAL_COMMUNITY): Payer: Self-pay

## 2021-11-16 ENCOUNTER — Other Ambulatory Visit (HOSPITAL_COMMUNITY): Payer: Self-pay

## 2021-11-18 ENCOUNTER — Other Ambulatory Visit: Payer: Self-pay | Admitting: Oncology

## 2021-11-18 DIAGNOSIS — C186 Malignant neoplasm of descending colon: Secondary | ICD-10-CM

## 2021-11-20 NOTE — Progress Notes (Signed)
[facility]  [address] [place],  [place]  [address] [phone]  Clinic Day: 11/21/21  Referring physician: Lowella Dandy, NP   ASSESSMENT & PLAN:   Adenocarcinoma of the descending colon This is a signet ring cell poorly differentiated lesion with lymphovascular invasion and penetration of the bowel wall.  This is a T3N2BM0, stage IIIc, with 12 of 32 nodes positive I explained the poor prognosis with this stage of disease and my recommendation for chemotherapy.  I understand she is 81 years old but she has a good performance status and so I do recommend adjuvant treatment.  We discussed the options of FOLFOX versus 5-FU alone leucovorin versus oral capecitabine.  I would recommend this for 6 months. She agreed to proceed with the capecitabine and started her first cycle the first week of July 2023.  She has tolerated this very well.  Iron deficiency anemia I recommend that she continue oral iron supplement for another 3 months to replace her iron stores.   She is tolerating treatment well and continues to work. Her hemoglobin is mildly worse at 10.0. We will plan to schedule her in 3 weeks with CBC and CMP before she starts her next cycle. I did recommend that she stay on oral iron supplement for another 3 months and also that she increase her fluids. The patient was provided an opportunity to ask questions and all were answered.  The patient agreed with the plan and demonstrated an understanding of the instructions.  The patient was advised to call back if the symptoms worsen or if the condition fails to improve as anticipated.    I provided 20 minutes of face-to-face time during this this encounter and > 50% was spent counseling as documented under my assessment and plan.    Derwood Kaplan, MD [facility] [address] [place] [address] Dept: [phone] Dept Fax:  [phone]   CHIEF COMPLAINT:  CC: Adenocarcinoma of the descending colon  Current Treatment: Recommendations for chemotherapy   HISTORY OF PRESENT ILLNESS:  Jenna Hutchinson is a 81 y.o. female with a history of newly diagnosed colon cancer who is referred in consultation with Dr. Orrin Brigham for assessment and management.  She presented with iron deficiency anemia and this normalized after oral iron supplement for 4 weeks.  She was referred to Dr. Nehemiah Settle for colonoscopy.  She does have a history of a colon polyp and her last colonoscopy was 15 years ago.  This revealed advanced diverticulosis of the sigmoid colon associated with angulation, spasm and muscular hypertrophy, requiring a pediatric scope.  She was found to have a malignant mass of the descending colon but this could not be passed with the scope.  Pathology revealed an invasive poorly differentiated adenocarcinoma arising in an adenoma with high-grade dysplasia.  Because of the limitations of the colonoscopy, she had a virtual colonography on April 12.  This confirmed an apple core lesion with irregular narrowing of the lumen at the splenic flexure, measuring approximately 4 cm.  She also had a sclerotic area of the left iliac bone which has been stable since 2014, and extensive diverticulosis.  She was referred to the surgeon and on May 11 had a left hemicolectomy.  Final pathology revealed a signet ring adenocarcinoma which was poorly cohesive and arising in a tubular adenoma with high-grade dysplasia.  This was a 6.3 cm grade 3 lesion which penetrated the  bowel wall and had lymphovascular invasion.  12 of 32 nodes were positive for metastasis, for a T3 N2b M0, stage IIIC adenocarcinoma of the colon.  MMR was normal and MSI stable.  She is recovering well from the surgery and did not realize that she might need additional treatment.  INTERVAL HISTORY:  Jenna Hutchinson is here today for a routine follow up. She is managing her pills  fairly well. She is due for her next cycle of Xeloda but has not received the pills yet. She will start back on Tuesday. We will check her blood right before she starts her next cycle of pills in 3 weeks. She reports her bowels are feeling normal and she is having more solid movements. Her skin is doing well.Her appetite is good.  She has lost 1 pound since her last visit.  Her blood pressure was slightly elevated today. She has been keeping track of her readings and they have been running good at home. Her hemoglobin was 10.5 and now 10.0, and her creatinine went up to 1.2. Her blood work is otherwise normal. She continues to take her iron supplements. She denies nausea, vomiting or bowel problems.  She denies cough, wheezing or dyspnea.  She denies chest pain, fevers or chills.  She denies any skin problems  I have reviewed her chart and materials related to her cancer extensively and collaborated history with the patient. Summary of oncologic history is as follows: Oncology History  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 ([facility])  08/11/2021 Cancer Staging   Staging form: Colon and Rectum, AJCC 8th Edition - Clinical stage from 08/11/2021: Stage IIIC (cT3, cN2b, cM0) - Signed by Derwood Kaplan, MD on 09/14/2021 Histopathologic type: Adenocarcinoma, NOS Stage prefix: Initial diagnosis Total positive nodes: 12 Total nodes examined: 32 Histologic grade (G): G3 Histologic grading system: 4 grade system Laterality: Left Tumor size (mm): 77 Lymph-vascular invasion (LVI): LVI present/identified, NOS Diagnostic confirmation: Positive histology Specimen type: Excision Staged by: Managing physician Tumor deposits (TD): Absent Carcinoembryonic antigen (CEA) (ng/mL): 3.6 Perineural invasion (PNI): Unknown Microsatellite instability (MSI): Stable KRAS mutation: Not assessed NRAS mutation: Not assessed BRAF mutation: Not assessed Stage used in treatment planning:  Yes National guidelines used in treatment planning: Yes Type of national guideline used in treatment planning: NCCN Staging comments: Over 81 years old, will use capecitabine at reduced dose as tolerated   08/25/2021 Initial Diagnosis   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 ([facility])   09/21/2021 - 09/21/2021 Chemotherapy   Patient is on Treatment Plan : COLORECTAL Capecitabine q21d       HISTORY:   Past Medical History:  Diagnosis Date   Arthritis    HTN (hypertension)    Hypothyroidism    Osteoporosis   History of myocardial infarction in 2017, with placement of 4 stents  Past Surgical History:  Procedure Laterality Date   BLADDER SURGERY     COLON SURGERY     eyelid surgery    Right lumpectomy in 1983 for a benign lesion  Family History  Problem Relation Age of Onset   Colon cancer Mother    Prostate cancer Brother     Social History:  reports that she has never smoked. She has never used smokeless tobacco. She reports that she does not drink alcohol and does not use drugs.The patient is accompanied by her husband today.  They have no children.  She continues to work in [employer].  She denies chemical exposures.  Allergies:  No Known Allergies  Current Medications: Current Outpatient Medications  Medication Sig Dispense Refill   aspirin 81 MG chewable tablet Chew by mouth.     capecitabine (XELODA) 500 MG tablet Take 2 tablets (1,000 mg total) by mouth 2 (two) times daily after a meal. Take for 14 days on, 7 days off. Repeat every 21 days. 56 tablet 5   carvedilol (COREG) 3.125 MG tablet Take 3.125 mg by mouth 2 (two) times daily.     Cholecalciferol 25 MCG (1000 UT) capsule Take by mouth.     fluticasone (FLONASE) 50 MCG/ACT nasal spray Place 2 sprays into both nostrils daily.     furosemide (LASIX) 20 MG tablet Take 20 mg by mouth daily.     hydroxychloroquine (PLAQUENIL) 200 MG tablet Take 200 mg by mouth 2 (two) times daily.     losartan  (COZAAR) 25 MG tablet Take 25 mg by mouth daily.     nitroGLYCERIN (NITROSTAT) 0.4 MG SL tablet DISSOLVE 1 TABLET UNDER THE TONGUE EVERY 5 MINUTES AS NEEDED FOR CHEST PAIN. CALL DOCTOR/ 911 IF TAKE 2 DOSES. MAX 3 TABLETS PER DAY     predniSONE (DELTASONE) 10 MG tablet Take 10 mg by mouth daily as needed.     PROLIA 60 MG/ML SOSY injection Inject into the skin.     rosuvastatin (CRESTOR) 20 MG tablet Take 1 tablet by mouth daily.     SYNTHROID 125 MCG tablet Take 125 mcg by mouth daily.     No current facility-administered medications for this visit.    REVIEW OF SYSTEMS:  Review of Systems  Constitutional: Negative.   HENT:  Negative.    Eyes: Negative.   Respiratory: Negative.    Cardiovascular: Negative.   Endocrine: Negative.   Genitourinary: Negative.    Musculoskeletal: Negative.   Skin: Negative.   Neurological: Negative.   Hematological: Negative.   Psychiatric/Behavioral: Negative.        VITALS:  Blood pressure (!) 142/67, pulse (!) 56, temperature 97.6 F (36.4 C), temperature source Oral, resp. rate 19, height 5' 1.7" (1.567 m), weight 138 lb 4.8 oz (62.7 kg), SpO2 99 %.  Wt Readings from Last 3 Encounters:  12/13/21 135 lb 14.4 oz (61.6 kg)  11/21/21 138 lb 4.8 oz (62.7 kg)  10/20/21 139 lb 14.4 oz (63.5 kg)    Body mass index is 25.54 kg/m.  Performance status (ECOG): 1 - Symptomatic but completely ambulatory  PHYSICAL EXAM:  Physical Exam Constitutional:      General: She is not in acute distress.    Appearance: Normal appearance. She is normal weight.  HENT:     Head: Normocephalic and atraumatic.  Eyes:     General: No scleral icterus.    Extraocular Movements: Extraocular movements intact.     Conjunctiva/sclera: Conjunctivae normal.     Pupils: Pupils are equal, round, and reactive to light.  Cardiovascular:     Rate and Rhythm: Normal rate and regular rhythm.     Pulses: Normal pulses.     Heart sounds: Normal heart sounds. No murmur heard.     No friction rub. No gallop.  Pulmonary:     Effort: Pulmonary effort is normal. No respiratory distress.     Breath sounds: Normal breath sounds.  Abdominal:     General: Bowel sounds are normal. There is no distension.     Palpations: Abdomen is soft. There is no hepatomegaly, splenomegaly or mass.     Tenderness: There is no abdominal tenderness.  Musculoskeletal:        General: Normal range of motion.     Cervical back: Normal range of motion and neck supple.  Lymphadenopathy:     Cervical: No cervical adenopathy.  Skin:    General: Skin is warm and dry.  Neurological:     General: No focal deficit present.     Mental Status: She is alert and oriented to person, place, and time. Mental status is at baseline.  Psychiatric:        Mood and Affect: Mood normal.        Behavior: Behavior normal.        Thought Content: Thought content normal.        Judgment: Judgment normal.      LABS:      Latest Ref Rng & Units 12/13/2021   12:00 AM 11/21/2021   12:00 AM 10/31/2021   12:00 AM  CBC  WBC  5.9     5.5     6.4      Hemoglobin 12.0 - 16.0 11.2     10.0     10.5      Hematocrit 36 - 46 32     30     32      Platelets 150 - 400 K/uL 212     180     244         This result is from an external source.      Latest Ref Rng & Units 12/13/2021   12:00 AM 11/21/2021   12:00 AM 10/31/2021   12:00 AM  CMP  BUN 4 - 21 24     24     21       Creatinine 0.5 - 1.1 1.1     1.2     1.1      Sodium 137 - 147 140     140     139      Potassium 3.5 - 5.1 mEq/L 3.8     3.8     3.8      Chloride 99 - 108 108     111     110      CO2 13 - 22 24     22     22       Calcium 8.7 - 10.7 9.7     9.2     9.2      Alkaline Phos 25 - 125 80     57     66      AST 13 - 35 27     41     37      ALT 7 - 35 U/L 43     26     22         This result is from an external source.     Lab Results  Component Value Date   CEA1 3.6 08/26/2021   /  CEA  Date Value Ref Range Status  08/26/2021 3.6 0.0  - 4.7 ng/mL Final    Comment:    (NOTE)                             Nonsmokers          <3.9                             Smokers             <  5.6 [employer] Electrochemiluminescence Immunoassay (ECLIA) Values obtained with different assay methods or kits cannot be used interchangeably.  Results cannot be interpreted as absolute evidence of the presence or absence of malignant disease. Performed At: [facility] [place], [place] [id number] Rush Farmer MD BU:[id number]    No results found for: "PSA1" No results found for: "[id number]" No results found for: "CAN125"  No results found for: "TOTALPROTELP", "ALBUMINELP", "A1GS", "A2GS", "BETS", "BETA2SER", "GAMS", "MSPIKE", "SPEI" No results found for: "TIBC", "FERRITIN", "IRONPCTSAT" No results found for: "LDH"  STUDIES:  No results found.  EXAM:07/14/2021 CT VIRTUAL COLONSCOPY DIAGNOSITC FINDINGS: VIRTUAL COLONOSCOPY  There is an area of apple-core like irregular narrowing of the lumen at the splenic flexure extending over approximately a 4 cm segment, seen best on coronal image 29, series 11 and from axial image 77, series 09/02 axial image ninety-one of series 9 concerning for malignancy. Extensive diverticulosis throughout the colon. Area of under distension noted in the mid sigmoid colon which could be related to diverticular disease or annular constricting lesion/mass. No other fixed annual lesions or polypoid filling defects.  Virtual colonoscopy is not designed to detect diminutive polyps (i.e., less than or equal to 5 mm), the presence or absence of which may not affect clinical management.  CT ABDOMEN AND PELVIS WITHOUT CONTRAST  Lower chest: No acute findings.  Hepatobiliary: No focal hepatic abnormality. Gallbladder unremarkable.  Pancreas: No focal abnormality or ductal dilatation.  Spleen: No focal abnormality. Normal size.  Adrenals/Urinary Tract: No adrenal abnormality. No  focal renal abnormality. No stones or hydronephrosis. Urinary bladder is unremarkable.  Stomach/Bowel: Stomach and small bowel decompressed, grossly unremarkable.  Vascular/Lymphatic: Aortoiliac atherosclerosis. No evidence of aneurysm or adenopathy.  Reproductive: Prior hysterectomy. No adnexal masses.  Other: No free fluid or free air.  Musculoskeletal: Sclerotic area within the left iliac bone was present on prior imaging from 2014, likely reflecting bone island. Degenerative changes in the lumbar spine.  IMPRESSION: Suspicious masslike irregular apple-core lesion at the splenic flexure over a 4 cm segment concerning for colon cancer.  Area of under distension in the mid sigmoid colon which could be related to diverticular disease although annular lesion cannot be excluded. Recommend correlation with colonoscopy.  Diffuse colonic diverticulosis.  Aortic atherosclerosis.

## 2021-11-21 ENCOUNTER — Encounter: Payer: Self-pay | Admitting: Oncology

## 2021-11-21 ENCOUNTER — Inpatient Hospital Stay: Payer: PPO

## 2021-11-21 ENCOUNTER — Inpatient Hospital Stay: Payer: PPO | Attending: Oncology | Admitting: Oncology

## 2021-11-21 ENCOUNTER — Other Ambulatory Visit (HOSPITAL_COMMUNITY): Payer: Self-pay

## 2021-11-21 VITALS — BP 142/67 | HR 56 | Temp 97.6°F | Resp 19 | Ht 61.7 in | Wt 138.3 lb

## 2021-11-21 DIAGNOSIS — C772 Secondary and unspecified malignant neoplasm of intra-abdominal lymph nodes: Secondary | ICD-10-CM

## 2021-11-21 DIAGNOSIS — D500 Iron deficiency anemia secondary to blood loss (chronic): Secondary | ICD-10-CM | POA: Diagnosis not present

## 2021-11-21 DIAGNOSIS — C186 Malignant neoplasm of descending colon: Secondary | ICD-10-CM | POA: Diagnosis not present

## 2021-11-21 DIAGNOSIS — D649 Anemia, unspecified: Secondary | ICD-10-CM | POA: Diagnosis not present

## 2021-11-21 LAB — CBC AND DIFFERENTIAL
HCT: 30 — AB (ref 36–46)
Hemoglobin: 10 — AB (ref 12.0–16.0)
Neutrophils Absolute: 3.03
Platelets: 180 10*3/uL (ref 150–400)
WBC: 5.5

## 2021-11-21 LAB — HEPATIC FUNCTION PANEL
ALT: 26 U/L (ref 7–35)
AST: 41 — AB (ref 13–35)
Alkaline Phosphatase: 57 (ref 25–125)
Bilirubin, Total: 0.7

## 2021-11-21 LAB — BASIC METABOLIC PANEL
BUN: 24 — AB (ref 4–21)
CO2: 22 (ref 13–22)
Chloride: 111 — AB (ref 99–108)
Creatinine: 1.2 — AB (ref 0.5–1.1)
Glucose: 101
Potassium: 3.8 mEq/L (ref 3.5–5.1)
Sodium: 140 (ref 137–147)

## 2021-11-21 LAB — CBC: RBC: 3.11 — AB (ref 3.87–5.11)

## 2021-11-21 LAB — COMPREHENSIVE METABOLIC PANEL
Albumin: 3.8 (ref 3.5–5.0)
Calcium: 9.2 (ref 8.7–10.7)

## 2021-11-30 ENCOUNTER — Other Ambulatory Visit (HOSPITAL_COMMUNITY): Payer: Self-pay

## 2021-12-02 DIAGNOSIS — I2510 Atherosclerotic heart disease of native coronary artery without angina pectoris: Secondary | ICD-10-CM | POA: Diagnosis not present

## 2021-12-02 DIAGNOSIS — I10 Essential (primary) hypertension: Secondary | ICD-10-CM | POA: Diagnosis not present

## 2021-12-02 DIAGNOSIS — R609 Edema, unspecified: Secondary | ICD-10-CM | POA: Diagnosis not present

## 2021-12-02 DIAGNOSIS — M810 Age-related osteoporosis without current pathological fracture: Secondary | ICD-10-CM | POA: Diagnosis not present

## 2021-12-02 DIAGNOSIS — D539 Nutritional anemia, unspecified: Secondary | ICD-10-CM | POA: Diagnosis not present

## 2021-12-02 DIAGNOSIS — E039 Hypothyroidism, unspecified: Secondary | ICD-10-CM | POA: Diagnosis not present

## 2021-12-02 DIAGNOSIS — M359 Systemic involvement of connective tissue, unspecified: Secondary | ICD-10-CM | POA: Diagnosis not present

## 2021-12-02 DIAGNOSIS — C772 Secondary and unspecified malignant neoplasm of intra-abdominal lymph nodes: Secondary | ICD-10-CM | POA: Diagnosis not present

## 2021-12-02 DIAGNOSIS — E559 Vitamin D deficiency, unspecified: Secondary | ICD-10-CM | POA: Diagnosis not present

## 2021-12-02 DIAGNOSIS — C186 Malignant neoplasm of descending colon: Secondary | ICD-10-CM | POA: Diagnosis not present

## 2021-12-02 DIAGNOSIS — E785 Hyperlipidemia, unspecified: Secondary | ICD-10-CM | POA: Diagnosis not present

## 2021-12-06 ENCOUNTER — Other Ambulatory Visit (HOSPITAL_COMMUNITY): Payer: Self-pay

## 2021-12-07 ENCOUNTER — Other Ambulatory Visit (HOSPITAL_COMMUNITY): Payer: Self-pay

## 2021-12-13 ENCOUNTER — Encounter: Payer: Self-pay | Admitting: Hematology and Oncology

## 2021-12-13 ENCOUNTER — Inpatient Hospital Stay: Payer: PPO | Attending: Oncology | Admitting: Hematology and Oncology

## 2021-12-13 ENCOUNTER — Inpatient Hospital Stay: Payer: PPO

## 2021-12-13 DIAGNOSIS — C772 Secondary and unspecified malignant neoplasm of intra-abdominal lymph nodes: Secondary | ICD-10-CM

## 2021-12-13 DIAGNOSIS — D649 Anemia, unspecified: Secondary | ICD-10-CM | POA: Diagnosis not present

## 2021-12-13 DIAGNOSIS — D500 Iron deficiency anemia secondary to blood loss (chronic): Secondary | ICD-10-CM

## 2021-12-13 DIAGNOSIS — C186 Malignant neoplasm of descending colon: Secondary | ICD-10-CM

## 2021-12-13 LAB — CBC AND DIFFERENTIAL
HCT: 32 — AB (ref 36–46)
Hemoglobin: 11.2 — AB (ref 12.0–16.0)
Neutrophils Absolute: 3.72
Platelets: 212 10*3/uL (ref 150–400)
WBC: 5.9

## 2021-12-13 LAB — BASIC METABOLIC PANEL
BUN: 24 — AB (ref 4–21)
CO2: 24 — AB (ref 13–22)
Chloride: 108 (ref 99–108)
Creatinine: 1.1 (ref 0.5–1.1)
Glucose: 93
Potassium: 3.8 mEq/L (ref 3.5–5.1)
Sodium: 140 (ref 137–147)

## 2021-12-13 LAB — HEPATIC FUNCTION PANEL
ALT: 43 U/L — AB (ref 7–35)
AST: 27 (ref 13–35)
Alkaline Phosphatase: 80 (ref 25–125)
Bilirubin, Total: 0.8

## 2021-12-13 LAB — COMPREHENSIVE METABOLIC PANEL
Albumin: 4.3 (ref 3.5–5.0)
Calcium: 9.7 (ref 8.7–10.7)

## 2021-12-13 LAB — CBC
MCV: 99 (ref 81–99)
RBC: 3.27 — AB (ref 3.87–5.11)

## 2021-12-13 NOTE — Assessment & Plan Note (Addendum)
Stage IIIC colon cancer diagnosed in May.  She underwent surgical resection.  She is receiving adjuvant oral chemotherapy with capecitibine 1000 mg twice daily for 14 days every 21 days. She will proceed with her 4th cycle at this time.  We will see her back in 3 weeks prior to her 5th cycle.

## 2021-12-13 NOTE — Progress Notes (Unsigned)
[facility]  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  She underwent surgical resection.  She is receiving adjuvant oral chemotherapy with capecitibine 1000 mg twice daily for 14 days every 21 days. She will proceed with her 4th cycle at this time.  We will see her back in 3 weeks prior to her 5th cycle.  Iron deficiency anemia due to chronic blood loss She continues oral iron supplement.   The patient understands the plans discussed today and is in agreement with them.  She knows to contact our office if she develops concerns prior to her next appointment.   I provided *** minutes of face-to-face time during this encounter and > 50% was spent counseling as documented under my assessment and plan.    Jenna Pickles, PA-C  [facility] AT [facility] [address] [place] [address] Dept: [phone] Dept Fax: [phone]   Orders Placed This Encounter  Procedures   CBC and differential    This external order was created through the Results Console.   CBC    This external order was created through the Results Console.   CBC    This order was created through External Result Entry   CBC and differential    This external order was created through the Results Console.   Basic metabolic panel    This external order was created through the Results Console.   Comprehensive metabolic panel    This external order was created through the Results Console.   Hepatic function panel    This external order was created through the Results Console.      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 ([place])  08/11/2021 Cancer Staging   Staging form: Colon and Rectum, AJCC 8th Edition - Clinical stage from 08/11/2021: Stage IIIC (cT3, cN2b, cM0) - Signed by Derwood Kaplan, MD on 09/14/2021 Histopathologic type: Adenocarcinoma, NOS Stage prefix: Initial diagnosis Total positive nodes: 12 Total nodes examined: 32 Histologic grade (G): G3 Histologic grading system: 4 grade system Laterality: Left Tumor size (mm): 35 Lymph-vascular invasion (LVI): LVI present/identified, NOS Diagnostic confirmation: Positive histology Specimen type: Excision Staged by: Managing physician Tumor deposits (TD): Absent Carcinoembryonic antigen (CEA) (ng/mL): 3.6 Perineural invasion (PNI): Unknown Microsatellite instability (MSI): Stable KRAS mutation: Not assessed NRAS mutation: Not assessed BRAF mutation: Not assessed Stage used in treatment planning: Yes National guidelines used in treatment planning: Yes Type of national guideline used in treatment planning: NCCN Staging comments: Over 41 years old, will use capecitabine at reduced dose as tolerated   08/25/2021 Initial Diagnosis   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 She denies fevers or chills. She denies pain. Her appetite is good. Her weight {Weight change:10426}.  REVIEW OF SYSTEMS:  Review of Systems  Constitutional:  Negative  for appetite change, chills, fatigue, fever and unexpected weight change.  HENT:   Negative for lump/mass, mouth sores and sore throat.   Respiratory:  Negative for cough and shortness of breath.   Cardiovascular:  Negative for chest pain and leg swelling.   Gastrointestinal:  Negative for abdominal pain, constipation, diarrhea, nausea and vomiting.  Endocrine: Negative for hot flashes.  Genitourinary:  Negative for difficulty urinating, dysuria, frequency and hematuria.   Musculoskeletal:  Negative for arthralgias, back pain and myalgias.  Skin:  Negative for rash.  Neurological:  Negative for dizziness and headaches.  Hematological:  Negative for adenopathy. Does not bruise/bleed easily.  Psychiatric/Behavioral:  Negative for depression and sleep disturbance. The patient is not nervous/anxious.      VITALS:  There were no vitals taken for this visit.  Wt Readings from Last 3 Encounters:  11/21/21 138 lb 4.8 oz (62.7 kg)  10/20/21 139 lb 14.4 oz (63.5 kg)  09/29/21 138 lb 4.8 oz (62.7 kg)    There is no height or weight on file to calculate BMI.  Performance status (ECOG): {CHL ONC [id number]  PHYSICAL EXAM:  Physical Exam Vitals and nursing note reviewed.  Constitutional:      General: She is not in acute distress.    Appearance: Normal appearance.  HENT:     Head: Normocephalic and atraumatic.     Mouth/Throat:     Mouth: Mucous membranes are moist.     Pharynx: Oropharynx is clear. No oropharyngeal exudate or posterior oropharyngeal erythema.  Eyes:     General: No scleral icterus.    Extraocular Movements: Extraocular movements intact.     Conjunctiva/sclera: Conjunctivae normal.     Pupils: Pupils are equal, round, and reactive to light.  Cardiovascular:     Rate and Rhythm: Normal rate and regular rhythm.     Heart sounds: Normal heart sounds. No murmur heard.    No friction rub. No gallop.  Pulmonary:     Effort: Pulmonary effort is normal.     Breath sounds: Normal breath sounds. No wheezing, rhonchi or rales.  Abdominal:     General: There is no distension.     Palpations: Abdomen is soft. There is no hepatomegaly, splenomegaly or mass.     Tenderness: There is no abdominal tenderness.  Musculoskeletal:         General: Normal range of motion.     Cervical back: Normal range of motion and neck supple. No tenderness.     Right lower leg: No edema.     Left lower leg: No edema.  Lymphadenopathy:     Cervical: No cervical adenopathy.     Upper Body:     Right upper body: No supraclavicular or axillary adenopathy.     Left upper body: No supraclavicular or axillary adenopathy.     Lower Body: No right inguinal adenopathy. No left inguinal adenopathy.  Skin:    General: Skin is warm and dry.     Coloration: Skin is not jaundiced.     Findings: No rash.  Neurological:     Mental Status: She is alert and oriented to person, place, and time.     Cranial Nerves: No cranial nerve deficit.  Psychiatric:        Mood and Affect: Mood normal.        Behavior: Behavior normal.        Thought Content: Thought content normal.     LABS:      Latest Ref Rng &  Units 12/13/2021   12:00 AM 11/21/2021   12:00 AM 10/31/2021   12:00 AM  CBC  WBC  5.9     5.5     6.4      Hemoglobin 12.0 - 16.0 11.2     10.0     10.5      Hematocrit 36 - 46 32     30     32      Platelets 150 - 400 K/uL 212     180     244         This result is from an external source.      Latest Ref Rng & Units 12/13/2021   12:00 AM 11/21/2021   12:00 AM 10/31/2021   12:00 AM  CMP  BUN 4 - 21 24     24     21       Creatinine 0.5 - 1.1 1.1     1.2     1.1      Sodium 137 - 147 140     140     139      Potassium 3.5 - 5.1 mEq/L 3.8     3.8     3.8      Chloride 99 - 108 108     111     110      CO2 13 - 22 24     22     22       Calcium 8.7 - 10.7 9.7     9.2     9.2      Alkaline Phos 25 - 125 80     57     66      AST 13 - 35 27     41     37      ALT 7 - 35 U/L 43     26     22         This result is from an external source.     Lab Results  Component Value Date   CEA1 3.6 08/26/2021   /  CEA  Date Value Ref Range Status  08/26/2021 3.6 0.0 - 4.7 ng/mL Final    Comment:    (NOTE)                              Nonsmokers          <3.9                             Smokers             <5.6 Roche [employer]ostics Electrochemiluminescence Immunoassay (ECLIA) Values obtained with different assay methods or kits cannot be used interchangeably.  Results cannot be interpreted as absolute evidence of the presence or absence of malignant disease. Performed At: [facility] [place], [place] [id number] Rush Farmer MD [id number]    No results found for: "PSA1" No results found for: [phone]" No results found for: "CAN125"  No results found for: "TOTALPROTELP", "ALBUMINELP", "A1GS", "A2GS", "BETS", "BETA2SER", "GAMS", "MSPIKE", "SPEI" No results found for: "TIBC", "FERRITIN", "IRONPCTSAT" No results found for: "LDH"  STUDIES:  No results found.    HISTORY:   Past Medical History:  Diagnosis Date   Arthritis    HTN (hypertension)    Hypothyroidism    Osteoporosis  Past Surgical History:  Procedure Laterality Date   BLADDER SURGERY     COLON SURGERY     eyelid surgery      Family History  Problem Relation Age of Onset   Colon cancer Mother    Prostate cancer Brother     Social History:  reports that she has never smoked. She has never used smokeless tobacco. She reports that she does not drink alcohol and does not use drugs.The patient is {Blank single:19197::"alone","accompanied by"} *** today.  Allergies: No Known Allergies  Current Medications: Current Outpatient Medications  Medication Sig Dispense Refill   aspirin 81 MG chewable tablet Chew by mouth.     capecitabine (XELODA) 500 MG tablet Take 2 tablets (1,000 mg total) by mouth 2 (two) times daily after a meal. Take for 14 days on, 7 days off. Repeat every 21 days. 56 tablet 5   carvedilol (COREG) 3.125 MG tablet Take 3.125 mg by mouth 2 (two) times daily.     Cholecalciferol 25 MCG (1000 UT) capsule Take by mouth.     fluticasone (FLONASE) 50 MCG/ACT nasal spray Place 2 sprays into both nostrils  daily.     furosemide (LASIX) 20 MG tablet Take 20 mg by mouth daily.     hydroxychloroquine (PLAQUENIL) 200 MG tablet Take 200 mg by mouth 2 (two) times daily.     losartan (COZAAR) 25 MG tablet Take 25 mg by mouth daily.     nitroGLYCERIN (NITROSTAT) 0.4 MG SL tablet DISSOLVE 1 TABLET UNDER THE TONGUE EVERY 5 MINUTES AS NEEDED FOR CHEST PAIN. CALL DOCTOR/ 911 IF TAKE 2 DOSES. MAX 3 TABLETS PER DAY     predniSONE (DELTASONE) 10 MG tablet Take 10 mg by mouth daily as needed.     PROLIA 60 MG/ML SOSY injection Inject into the skin.     rosuvastatin (CRESTOR) 20 MG tablet Take 1 tablet by mouth daily.     SYNTHROID 125 MCG tablet Take 125 mcg by mouth daily.     No current facility-administered medications for this visit.

## 2021-12-13 NOTE — Assessment & Plan Note (Signed)
She continues oral iron supplement.

## 2021-12-14 ENCOUNTER — Encounter: Payer: Self-pay | Admitting: Hematology and Oncology

## 2021-12-23 ENCOUNTER — Other Ambulatory Visit (HOSPITAL_COMMUNITY): Payer: Self-pay

## 2021-12-28 ENCOUNTER — Other Ambulatory Visit (HOSPITAL_COMMUNITY): Payer: Self-pay

## 2022-01-02 ENCOUNTER — Telehealth: Payer: Self-pay | Admitting: Hematology and Oncology

## 2022-01-02 ENCOUNTER — Inpatient Hospital Stay: Payer: PPO

## 2022-01-02 ENCOUNTER — Inpatient Hospital Stay: Payer: PPO | Attending: Oncology | Admitting: Hematology and Oncology

## 2022-01-02 ENCOUNTER — Encounter: Payer: Self-pay | Admitting: Hematology and Oncology

## 2022-01-02 VITALS — BP 117/58 | HR 56 | Temp 97.5°F | Resp 20 | Ht 61.7 in | Wt 134.6 lb

## 2022-01-02 DIAGNOSIS — C772 Secondary and unspecified malignant neoplasm of intra-abdominal lymph nodes: Secondary | ICD-10-CM | POA: Diagnosis not present

## 2022-01-02 DIAGNOSIS — D649 Anemia, unspecified: Secondary | ICD-10-CM | POA: Diagnosis not present

## 2022-01-02 DIAGNOSIS — D539 Nutritional anemia, unspecified: Secondary | ICD-10-CM

## 2022-01-02 DIAGNOSIS — I25118 Atherosclerotic heart disease of native coronary artery with other forms of angina pectoris: Secondary | ICD-10-CM | POA: Diagnosis not present

## 2022-01-02 DIAGNOSIS — C186 Malignant neoplasm of descending colon: Secondary | ICD-10-CM | POA: Diagnosis not present

## 2022-01-02 DIAGNOSIS — D500 Iron deficiency anemia secondary to blood loss (chronic): Secondary | ICD-10-CM

## 2022-01-02 DIAGNOSIS — I252 Old myocardial infarction: Secondary | ICD-10-CM | POA: Diagnosis not present

## 2022-01-02 DIAGNOSIS — E7849 Other hyperlipidemia: Secondary | ICD-10-CM | POA: Diagnosis not present

## 2022-01-02 LAB — CBC AND DIFFERENTIAL
HCT: 31 — AB (ref 36–46)
Hemoglobin: 10.6 — AB (ref 12.0–16.0)
MCV: 102 — AB (ref 81–99)
Neutrophils Absolute: 3.72
Platelets: 190 10*3/uL (ref 150–400)
WBC: 5.9

## 2022-01-02 LAB — BASIC METABOLIC PANEL
BUN: 24 — AB (ref 4–21)
CO2: 23 — AB (ref 13–22)
Chloride: 110 — AB (ref 99–108)
Creatinine: 1.1 (ref 0.5–1.1)
Glucose: 96
Potassium: 4.2 mEq/L (ref 3.5–5.1)
Sodium: 138 (ref 137–147)

## 2022-01-02 LAB — COMPREHENSIVE METABOLIC PANEL
Albumin: 3.9 (ref 3.5–5.0)
Calcium: 9.4 (ref 8.7–10.7)

## 2022-01-02 LAB — HEPATIC FUNCTION PANEL
ALT: 23 U/L (ref 7–35)
AST: 37 — AB (ref 13–35)
Alkaline Phosphatase: 71 (ref 25–125)
Bilirubin, Total: 0.6

## 2022-01-02 LAB — CBC: RBC: 3.01 — AB (ref 3.87–5.11)

## 2022-01-02 NOTE — Assessment & Plan Note (Signed)
She has not been taking her oral iron supplement regularly.  Her anemia is now macrocytic, so unlikely due to iron deficiency.  I will check a B12 and folate today.  If these are normal, I will plan to repeat iron studies.

## 2022-01-02 NOTE — Progress Notes (Signed)
[facility]  [address] 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  She underwent surgical resection.  She is receiving adjuvant oral chemotherapy with capecitibine 1000 mg twice daily for 14 days every 21 days.  She continues to tolerate this without significant difficulty.  She will proceed with her 5th cycle at this time.  We will see her back in 3 weeks prior to her 6th cycle.  Iron deficiency anemia due to chronic blood loss She has not been taking her oral iron supplement regularly.  Her anemia is now macrocytic, so unlikely due to iron deficiency.  I will check a B12 and folate today.  If these are normal, I will plan to repeat iron studies.   The patient understands the plans discussed today and is in agreement with them.  She knows to contact our office if she develops concerns prior to her next appointment.   Jenna Pickles, PA-C  [facility] AT [facility] [address] [place] [address] Dept: [phone] Dept Fax: [phone]   Orders Placed This Encounter  Procedures   CBC and differential    This external order was created through the Results Console.   CBC    This external order was created through the Results Console.   Folate    Standing Status:   Future    Standing Expiration Date:   01/03/2023   Vitamin B12    Standing Status:   Future    Standing Expiration Date:   74/04/2876   Basic metabolic panel    This external order was created through the Results Console.   Comprehensive metabolic panel    This external order was created through the Results Console.   Hepatic function panel    This external order was created through the Results Console.       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 ([place])  08/11/2021 Cancer Staging   Staging form: Colon and Rectum, AJCC 8th Edition - Clinical stage from 08/11/2021: Stage IIIC (cT3, cN2b, cM0) - Signed by Derwood Kaplan, MD on 09/14/2021 Histopathologic type: Adenocarcinoma, NOS Stage prefix: Initial diagnosis Total positive nodes: 12 Total nodes examined: 32 Histologic grade (G): G3 Histologic grading system: 4 grade system Laterality: Left Tumor size (mm): 2 Lymph-vascular invasion (LVI): LVI present/identified, NOS Diagnostic confirmation: Positive histology Specimen type: Excision Staged by: Managing physician Tumor deposits (TD): Absent Carcinoembryonic antigen (CEA) (ng/mL): 3.6 Perineural invasion (PNI): Unknown Microsatellite instability (MSI): Stable KRAS mutation: Not assessed NRAS mutation: Not assessed BRAF mutation: Not assessed Stage used in treatment planning: Yes National guidelines used in treatment planning: Yes Type of national guideline used in treatment planning: NCCN Staging comments: Over 43 years old, will use capecitabine at reduced dose as tolerated   08/25/2021 Initial Diagnosis   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  She continues to tolerate this well except for occasional diarrhea, not requiring medication. She denies fevers or chills. She denies pain. Her appetite is good. Her weight has decreased 1 pounds over last 3 weeks .  She has just come from seeing her cardiologist.  REVIEW OF SYSTEMS:  Review of Systems   Constitutional:  Negative for appetite change, chills, fatigue, fever and unexpected weight change.  HENT:   Negative for lump/mass, mouth sores and sore throat.   Respiratory:  Negative for cough and shortness of breath.   Cardiovascular:  Negative for chest pain and leg swelling.  Gastrointestinal:  Positive for diarrhea. Negative for abdominal pain, constipation, nausea and vomiting.  Endocrine: Negative for hot flashes.  Genitourinary:  Negative for difficulty urinating, dysuria, frequency and hematuria.   Musculoskeletal:  Negative for arthralgias, back pain and myalgias.  Skin:  Negative for rash.  Neurological:  Negative for dizziness and headaches.  Hematological:  Negative for adenopathy. Does not bruise/bleed easily.  Psychiatric/Behavioral:  Negative for depression and sleep disturbance. The patient is not nervous/anxious.      VITALS:  Blood pressure (!) 117/58, pulse (!) 56, temperature (!) 97.5 F (36.4 C), temperature source Oral, resp. rate 20, height 5' 1.7" (1.567 m), weight 134 lb 9.6 oz (61.1 kg), SpO2 99 %.  Wt Readings from Last 3 Encounters:  01/02/22 134 lb 9.6 oz (61.1 kg)  12/13/21 135 lb 14.4 oz (61.6 kg)  11/21/21 138 lb 4.8 oz (62.7 kg)    Body mass index is 24.86 kg/m.  Performance status (ECOG): 1 - Symptomatic but completely ambulatory  PHYSICAL EXAM:  Physical Exam Vitals and nursing note reviewed.  Constitutional:      General: She is not in acute distress.    Appearance: Normal appearance.  HENT:     Head: Normocephalic and atraumatic.     Mouth/Throat:     Mouth: Mucous membranes are moist.     Pharynx: Oropharynx is clear. No oropharyngeal exudate or posterior oropharyngeal erythema.  Eyes:     General: No scleral icterus.    Extraocular Movements: Extraocular movements intact.     Conjunctiva/sclera: Conjunctivae normal.     Pupils: Pupils are equal, round, and reactive to light.  Cardiovascular:     Rate and Rhythm: Normal rate and  regular rhythm.     Heart sounds: Normal heart sounds. No murmur heard.    No friction rub. No gallop.  Pulmonary:     Effort: Pulmonary effort is normal.     Breath sounds: Normal breath sounds. No wheezing, rhonchi or rales.  Abdominal:     General: There is no distension.     Palpations: Abdomen is soft. There is no hepatomegaly, splenomegaly or mass.     Tenderness: There is no abdominal tenderness.  Musculoskeletal:        General: Normal range of motion.     Cervical back: Normal range of motion and neck supple. No tenderness.     Right lower leg: No edema.     Left lower leg: No edema.  Lymphadenopathy:     Cervical: No cervical adenopathy.     Upper Body:     Right upper body: No supraclavicular or axillary adenopathy.     Left upper body: No supraclavicular or axillary adenopathy.     Lower Body: No right inguinal adenopathy. No left inguinal adenopathy.  Skin:    General: Skin is warm and dry.     Coloration: Skin is  not jaundiced.     Findings: No rash.  Neurological:     Mental Status: She is alert and oriented to person, place, and time.     Cranial Nerves: No cranial nerve deficit.  Psychiatric:        Mood and Affect: Mood normal.        Behavior: Behavior normal.        Thought Content: Thought content normal.     LABS:      Latest Ref Rng & Units 01/02/2022   12:00 AM 12/13/2021   12:00 AM 11/21/2021   12:00 AM  CBC  WBC  5.9     5.9     5.5      Hemoglobin 12.0 - 16.0 10.6     11.2     10.0      Hematocrit 36 - 46 31     32     30      Platelets 150 - 400 K/uL 190     212     180         This result is from an external source.      Latest Ref Rng & Units 01/02/2022   12:00 AM 12/13/2021   12:00 AM 11/21/2021   12:00 AM  CMP  BUN 4 - _0 Creatinine 0.5 - 1.1 1.1     1.1     1.2      Sodium 137 - 147 138     140     140      Potassium 3.5 - 5.1 mEq/L 4.2     3.8     3.8      Chloride 99 - 108 110     108     111      CO2 13 -  _1 Calcium 8.7 - 10.7 9.4     9.7     9.2      Alkaline Phos 25 - 125 71     80     57      AST 13 - 35 37     27     41      ALT 7 - 35 U/L 23     43     26         This result is from an external source.     Lab Results  Component Value Date   CEA1 3.6 08/26/2021   /  CEA  Date Value Ref Range Status  08/26/2021 3.6 0.0 - 4.7 ng/mL Final    Comment:    (NOTE)                             Nonsmokers          <3.9                             Smokers             <5.6 [employer] Electrochemiluminescence Immunoassay (ECLIA) Values obtained with different assay methods or kits cannot be used interchangeably.  Results cannot be interpreted as absolute evidence of the presence or absence of malignant disease. Performed At: [facility] [employer] [address]  [place], [place] [id number] Rush Farmer MD [id number]    No results found for: "PSA1" No results found for: "CAN199" No results found for: "CAN125"  No results found for: "TOTALPROTELP", "ALBUMINELP", "A1GS", "A2GS", "BETS", "BETA2SER", "GAMS", "MSPIKE", "SPEI" No results found for: "TIBC", "FERRITIN", "IRONPCTSAT" No results found for: "LDH"  STUDIES:  No results found.    HISTORY:   Past Medical History:  Diagnosis Date   Arthritis    HTN (hypertension)    Hypothyroidism    Osteoporosis     Past Surgical History:  Procedure Laterality Date   BLADDER SURGERY     COLON SURGERY     eyelid surgery      Family History  Problem Relation Age of Onset   Colon cancer Mother    Prostate cancer Brother     Social History:  reports that she has never smoked. She has never used smokeless tobacco. She reports that she does not drink alcohol and does not use drugs.The patient is alone today.  Allergies: No Known Allergies  Current Medications: Current Outpatient Medications  Medication Sig Dispense Refill   nitroGLYCERIN (NITROSTAT) 0.4 MG SL tablet Place under the tongue.      aspirin 81 MG chewable tablet Chew by mouth.     capecitabine (XELODA) 500 MG tablet Take 2 tablets (1,000 mg total) by mouth 2 (two) times daily after a meal. Take for 14 days on, 7 days off. Repeat every 21 days. 56 tablet 5   carvedilol (COREG) 3.125 MG tablet Take 3.125 mg by mouth 2 (two) times daily.     Cholecalciferol 25 MCG (1000 UT) capsule Take by mouth.     fluticasone (FLONASE) 50 MCG/ACT nasal spray Place 2 sprays into both nostrils daily.     furosemide (LASIX) 20 MG tablet Take 20 mg by mouth daily.     hydroxychloroquine (PLAQUENIL) 200 MG tablet Take 200 mg by mouth 2 (two) times daily.     losartan (COZAAR) 25 MG tablet Take 25 mg by mouth daily.     predniSONE (DELTASONE) 10 MG tablet Take 10 mg by mouth daily as needed.     PROLIA 60 MG/ML SOSY injection Inject into the skin.     rosuvastatin (CRESTOR) 20 MG tablet Take 1 tablet by mouth daily.     SYNTHROID 125 MCG tablet Take 125 mcg by mouth daily.     Zinc Gluconate 50 MG CAPS Take 1 tablet by mouth daily.     No current facility-administered medications for this visit.

## 2022-01-02 NOTE — Telephone Encounter (Signed)
Patient has been scheduled for follow-up visit per 01/02/22 los. Pt given an appt calendar with date and time.  

## 2022-01-02 NOTE — Assessment & Plan Note (Signed)
Stage IIIC colon cancer diagnosed in May.  She underwent surgical resection.  She is receiving adjuvant oral chemotherapy with capecitibine 1000 mg twice daily for 14 days every 21 days.  She continues to tolerate this without significant difficulty.  She will proceed with her 5th cycle at this time.  We will see her back in 3 weeks prior to her 6th cycle.

## 2022-01-12 ENCOUNTER — Other Ambulatory Visit (HOSPITAL_COMMUNITY): Payer: Self-pay

## 2022-01-12 ENCOUNTER — Other Ambulatory Visit: Payer: Self-pay | Admitting: Oncology

## 2022-01-12 DIAGNOSIS — C772 Secondary and unspecified malignant neoplasm of intra-abdominal lymph nodes: Secondary | ICD-10-CM

## 2022-01-12 MED ORDER — CAPECITABINE 500 MG PO TABS
625.0000 mg/m2 | ORAL_TABLET | Freq: Two times a day (BID) | ORAL | 5 refills | Status: DC
  Filled 2022-01-13: qty 56, 21d supply, fill #0
  Filled 2022-02-02: qty 56, 21d supply, fill #1
  Filled 2022-02-28: qty 56, 21d supply, fill #2
  Filled 2022-03-21: qty 56, 21d supply, fill #3

## 2022-01-13 ENCOUNTER — Other Ambulatory Visit (HOSPITAL_COMMUNITY): Payer: Self-pay

## 2022-01-19 ENCOUNTER — Other Ambulatory Visit (HOSPITAL_COMMUNITY): Payer: Self-pay

## 2022-01-23 ENCOUNTER — Other Ambulatory Visit: Payer: PPO

## 2022-01-23 ENCOUNTER — Ambulatory Visit: Payer: PPO | Admitting: Oncology

## 2022-01-25 ENCOUNTER — Encounter: Payer: Self-pay | Admitting: Hematology and Oncology

## 2022-01-25 ENCOUNTER — Inpatient Hospital Stay: Payer: PPO

## 2022-01-25 ENCOUNTER — Inpatient Hospital Stay (INDEPENDENT_AMBULATORY_CARE_PROVIDER_SITE_OTHER): Payer: PPO | Admitting: Hematology and Oncology

## 2022-01-25 DIAGNOSIS — C186 Malignant neoplasm of descending colon: Secondary | ICD-10-CM | POA: Diagnosis not present

## 2022-01-25 DIAGNOSIS — C772 Secondary and unspecified malignant neoplasm of intra-abdominal lymph nodes: Secondary | ICD-10-CM

## 2022-01-25 DIAGNOSIS — D539 Nutritional anemia, unspecified: Secondary | ICD-10-CM | POA: Diagnosis not present

## 2022-01-25 DIAGNOSIS — D649 Anemia, unspecified: Secondary | ICD-10-CM | POA: Diagnosis not present

## 2022-01-25 DIAGNOSIS — D500 Iron deficiency anemia secondary to blood loss (chronic): Secondary | ICD-10-CM | POA: Diagnosis not present

## 2022-01-25 LAB — BASIC METABOLIC PANEL
BUN: 23 — AB (ref 4–21)
CO2: 23 — AB (ref 13–22)
Chloride: 108 (ref 99–108)
Creatinine: 1.3 — AB (ref 0.5–1.1)
Glucose: 93
Potassium: 4.1 mEq/L (ref 3.5–5.1)
Sodium: 136 — AB (ref 137–147)

## 2022-01-25 LAB — COMPREHENSIVE METABOLIC PANEL
Albumin: 3.8 (ref 3.5–5.0)
Calcium: 9.7 (ref 8.7–10.7)

## 2022-01-25 LAB — FOLATE: Folate: 10.2 ng/mL (ref >5.9)

## 2022-01-25 LAB — CBC AND DIFFERENTIAL
HCT: 31 — AB (ref 36–46)
Hemoglobin: 10.7 — AB (ref 12.0–16.0)
MCV: 104 — AB (ref 81–99)
Neutrophils Absolute: 6.84
Platelets: 292 10*3/uL (ref 150–400)
WBC: 9

## 2022-01-25 LAB — HEPATIC FUNCTION PANEL
ALT: 24 U/L (ref 7–35)
AST: 29 (ref 13–35)
Alkaline Phosphatase: 87 (ref 25–125)
Bilirubin, Total: 0.6

## 2022-01-25 LAB — VITAMIN B12: Vitamin B-12: 166 pg/mL — ABNORMAL LOW (ref 180–914)

## 2022-01-25 LAB — CBC: RBC: 2.96 — AB (ref 3.87–5.11)

## 2022-01-25 NOTE — Assessment & Plan Note (Signed)
Stage IIIC colon cancer diagnosed in May.  She underwent surgical resection.  She is receiving adjuvant oral chemotherapy with capecitibine 1000 mg twice daily for 14 days every 21 days.  She continues to tolerate this fairly well.  She is having desquamation and intermittent pain of both feet.  She is using moisturizers regularly.  Her shoes are pointed so fit quite tight in the toes.  I recommended she avoid wearing them as much as possible.  She states these are the only shoes that fit her..  She will proceed with her 6th cycle at this time.  We will see her back in 3 weeks for repeat clinical assessment prior to her 7th cycle.

## 2022-01-25 NOTE — Assessment & Plan Note (Signed)
She has not been taking her oral iron supplement regularly.  Her anemia is now macrocytic, so unlikely due to iron deficiency.  I will check a B12 and folate today.  If these are normal, I will plan to repeat iron studies.

## 2022-01-25 NOTE — Progress Notes (Signed)
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  Jenna Hutchinson underwent surgical resection.  Jenna Hutchinson is receiving adjuvant oral chemotherapy with capecitibine 1000 mg twice daily for 14 days every 21 days.  Jenna Hutchinson continues to tolerate this fairly well.  Jenna Hutchinson is having desquamation and intermittent pain of both feet.  Jenna Hutchinson is using moisturizers regularly.  Jenna Hutchinson shoes are pointed so fit quite tight in Jenna toes.  I recommended Jenna Hutchinson avoid wearing them as much as possible.  Jenna Hutchinson states these are Jenna only shoes that fit Jenna Hutchinson..  Jenna Hutchinson will proceed with Jenna Hutchinson 6th cycle at this time.  We will see Jenna Hutchinson back in 3 weeks for repeat clinical assessment prior to Jenna Hutchinson 7th cycle.  Iron deficiency anemia due to chronic blood loss Jenna Hutchinson has not been taking Jenna Hutchinson oral iron supplement regularly.  Jenna Hutchinson anemia is now macrocytic, so unlikely due to iron deficiency.  I will check a B12 and folate today.  If these are normal, I will plan to repeat iron studies.   Jenna Hutchinson understands Jenna plans discussed today and is in agreement with them.  Jenna Hutchinson knows to contact our office if Jenna Hutchinson develops concerns prior to Jenna Hutchinson next appointment.       Jenna Pickles, PA-C  [facility] AT [facility] [address] [place] [address] Dept: [phone] Dept Fax: [phone]   Orders Placed This Encounter  Procedures   CBC and differential    This external order was created through Jenna Results Console.   CBC    This external order was created through Jenna Results Console.   Basic metabolic panel    This external order was created through Jenna Results Console.   Comprehensive metabolic panel    This external order was created  through Jenna Results Console.   Hepatic function panel    This external order was created through Jenna Results Console.      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 ([place])  08/11/2021 Cancer Staging   Staging form: Colon and Rectum, AJCC 8th Edition - Clinical stage from 08/11/2021: Stage IIIC (cT3, cN2b, cM0) - Signed by Derwood Kaplan, MD on 09/14/2021 Histopathologic type: Adenocarcinoma, NOS Stage prefix: Initial diagnosis Total positive nodes: 12 Total nodes examined: 32 Histologic grade (G): G3 Histologic grading system: 4 grade system Laterality: Left Tumor size (mm): 8 Lymph-vascular invasion (LVI): LVI present/identified, NOS Diagnostic confirmation: Positive histology Specimen type: Excision Staged by: Managing physician Tumor deposits (TD): Absent Carcinoembryonic antigen (CEA) (ng/mL): 3.6 Perineural invasion (PNI): Unknown Microsatellite instability (MSI): Stable KRAS mutation: Not assessed NRAS mutation: Not assessed BRAF mutation: Not assessed Stage used in treatment planning: Yes National guidelines used in treatment planning: Yes Type of national guideline used in treatment planning: NCCN Staging comments: Over 74 years old, will use capecitabine at reduced dose as tolerated   08/25/2021 Initial Diagnosis   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  Jenna Hutchinson continues capecitabine 1000 mg twice daily for 2 weeks on and 1 week off.  Jenna Hutchinson states Jenna Hutchinson continues to tolerate this fairly well.  Jenna Hutchinson has been having desquamation of Jenna feet, as well as  intermittent pain, but is not severe.  Jenna Hutchinson hands have been dry but not peeling.  Jenna Hutchinson is using moisturizer regularly.  Jenna Hutchinson has intermittent upper abdominal pain and increased gas, for which Jenna Hutchinson is using simethicone as needed. Jenna Hutchinson denies fevers or chills. Jenna Hutchinson appetite is good. Jenna Hutchinson weight has been stable.  REVIEW OF SYSTEMS:  Review of Systems  Constitutional:  Negative for appetite change, chills, fatigue, fever and unexpected weight change.  HENT:   Negative for lump/mass, mouth sores and sore throat.   Respiratory:  Negative for cough and shortness of breath.   Cardiovascular:  Negative for chest pain and leg swelling.  Gastrointestinal:  Positive for abdominal pain. Negative for constipation, diarrhea, nausea and vomiting.  Endocrine: Negative for hot flashes.  Genitourinary:  Negative for difficulty urinating, dysuria, frequency and hematuria.   Musculoskeletal:  Negative for arthralgias, back pain and myalgias.  Skin:  Negative for rash.  Neurological:  Negative for dizziness and headaches.  Hematological:  Negative for adenopathy. Does not bruise/bleed easily.  Psychiatric/Behavioral:  Negative for depression and sleep disturbance. Jenna Hutchinson is not nervous/anxious.      VITALS:  Blood pressure (!) 154/69, pulse (!) 54, temperature 97.8 F (36.6 C), temperature source Oral, resp. rate 20, height 5' 1.7" (1.567 m), weight 134 lb (60.8 kg), SpO2 100 %.  Wt Readings from Last 3 Encounters:  01/25/22 134 lb (60.8 kg)  01/02/22 134 lb 9.6 oz (61.1 kg)  12/13/21 135 lb 14.4 oz (61.6 kg)    Body mass index is 24.75 kg/m.  Performance status (ECOG): 1 - Symptomatic but completely ambulatory  PHYSICAL EXAM:  Physical Exam Vitals and nursing note reviewed.  Constitutional:      General: Jenna Hutchinson is not in acute distress.    Appearance: Normal appearance.  HENT:     Head: Normocephalic and atraumatic.     Mouth/Throat:     Mouth: Mucous membranes are moist.     Pharynx: Oropharynx is  clear. No oropharyngeal exudate or posterior oropharyngeal erythema.  Eyes:     General: No scleral icterus.    Extraocular Movements: Extraocular movements intact.     Conjunctiva/sclera: Conjunctivae normal.     Pupils: Pupils are equal, round, and reactive to light.  Cardiovascular:     Rate and Rhythm: Normal rate and regular rhythm.     Heart sounds: Normal heart sounds. No murmur heard.    No friction rub. No gallop.  Pulmonary:     Effort: Pulmonary effort is normal.     Breath sounds: Normal breath sounds. No wheezing, rhonchi or rales.  Abdominal:     General: There is no distension.     Palpations: Abdomen is soft. There is no hepatomegaly, splenomegaly or mass.     Tenderness: There is no abdominal tenderness.  Musculoskeletal:        General: Normal range of motion.     Cervical back: Normal range of motion and neck supple. No tenderness.     Right lower leg: No edema.     Left lower leg: No edema.  Lymphadenopathy:     Cervical: No cervical adenopathy.     Upper Body:  Right upper body: No supraclavicular or axillary adenopathy.     Left upper body: No supraclavicular or axillary adenopathy.     Lower Body: No right inguinal adenopathy. No left inguinal adenopathy.  Skin:    General: Skin is warm and dry.     Coloration: Skin is not jaundiced.     Findings: No rash.     Comments: Moderate dry desquamation of Jenna bilateral plantar surfaces of feet, with mild erythema.  There is no pain on palpation.  Neurological:     Mental Status: Jenna Hutchinson is alert and oriented to person, place, and time.     Cranial Nerves: No cranial nerve deficit.  Psychiatric:        Mood and Affect: Mood normal.        Behavior: Behavior normal.        Thought Content: Thought content normal.     LABS:      Latest Ref Rng & Units 01/25/2022   12:00 AM 01/02/2022   12:00 AM 12/13/2021   12:00 AM  CBC  WBC  9.0     5.9     5.9      Hemoglobin 12.0 - 16.0 10.7     10.6     11.2       Hematocrit 36 - 46 31     31     32      Platelets 150 - 400 K/uL 292     190     212         This result is from an external source.      Latest Ref Rng & Units 01/25/2022   12:00 AM 01/02/2022   12:00 AM 12/13/2021   12:00 AM  CMP  BUN 4 - 21 23     24     24       Creatinine 0.5 - 1.1 1.3     1.1     1.1      Sodium 137 - 147 136     138     140      Potassium 3.5 - 5.1 mEq/L 4.1     4.2     3.8      Chloride 99 - 108 108     110     108      CO2 13 - 22 23     23     24       Calcium 8.7 - 10.7 9.7     9.4     9.7      Alkaline Phos 25 - 125 87     71     80      AST 13 - 35 29     37     27      ALT 7 - 35 U/L 24     23     43         This result is from an external source.     Lab Results  Component Value Date   CEA1 3.6 08/26/2021   /  CEA  Date Value Ref Range Status  08/26/2021 3.6 0.0 - 4.7 ng/mL Final    Comment:    (NOTE)                             Nonsmokers          <3.9  Smokers             <5.6 Roche [employer] Electrochemiluminescence Immunoassay (ECLIA) Values obtained with different assay methods or kits cannot be used interchangeably.  Results cannot be interpreted as absolute evidence of Jenna presence or absence of malignant disease. Performed At: [facility], [place] [id number] Rush Farmer MD [id number]    No results found for: "PSA1" No results found for: [phone]" No results found for: "CAN125"  No results found for: "TOTALPROTELP", "ALBUMINELP", "A1GS", "A2GS", "BETS", "BETA2SER", "GAMS", "MSPIKE", "SPEI" No results found for: "TIBC", "FERRITIN", "IRONPCTSAT" No results found for: "LDH"  STUDIES:  No results found.    HISTORY:   Past Medical History:  Diagnosis Date   Arthritis    HTN (hypertension)    Hypothyroidism    Osteoporosis     Past Surgical History:  Procedure Laterality Date   BLADDER SURGERY     COLON SURGERY     eyelid surgery      Family  History  Problem Relation Age of Onset   Colon cancer Mother    Prostate cancer Brother     Social History:  reports that Jenna Hutchinson has never smoked. Jenna Hutchinson has never used smokeless tobacco. Jenna Hutchinson reports that Jenna Hutchinson does not drink alcohol and does not use drugs.Jenna Hutchinson is alone today.  Allergies: No Known Allergies  Current Medications: Current Outpatient Medications  Medication Sig Dispense Refill   aspirin 81 MG chewable tablet Chew by mouth.     capecitabine (XELODA) 500 MG tablet Take 2 tablets (1,000 mg total) by mouth 2 (two) times daily after a meal. Take for 14 days on, 7 days off. Repeat every 21 days. 56 tablet 5   carvedilol (COREG) 3.125 MG tablet Take 3.125 mg by mouth 2 (two) times daily.     Cholecalciferol 25 MCG (1000 UT) capsule Take by mouth.     fluticasone (FLONASE) 50 MCG/ACT nasal spray Place 2 sprays into both nostrils daily.     furosemide (LASIX) 20 MG tablet Take 20 mg by mouth daily.     hydroxychloroquine (PLAQUENIL) 200 MG tablet Take 200 mg by mouth 2 (two) times daily.     losartan (COZAAR) 25 MG tablet Take 25 mg by mouth daily.     nitroGLYCERIN (NITROSTAT) 0.4 MG SL tablet Place under Jenna tongue.     predniSONE (DELTASONE) 10 MG tablet Take 10 mg by mouth daily as needed.     PROLIA 60 MG/ML SOSY injection Inject into Jenna skin.     rosuvastatin (CRESTOR) 20 MG tablet Take 1 tablet by mouth daily.     SYNTHROID 125 MCG tablet Take 125 mcg by mouth daily.     Zinc Gluconate 50 MG CAPS Take 1 tablet by mouth daily. (Hutchinson not taking: Reported on 01/25/2022)     No current facility-administered medications for this visit.

## 2022-01-26 ENCOUNTER — Telehealth: Payer: Self-pay

## 2022-01-26 ENCOUNTER — Other Ambulatory Visit: Payer: Self-pay | Admitting: Hematology and Oncology

## 2022-01-26 ENCOUNTER — Telehealth: Payer: Self-pay | Admitting: Oncology

## 2022-01-26 ENCOUNTER — Encounter: Payer: Self-pay | Admitting: Hematology and Oncology

## 2022-01-26 DIAGNOSIS — D518 Other vitamin B12 deficiency anemias: Secondary | ICD-10-CM

## 2022-01-26 DIAGNOSIS — D519 Vitamin B12 deficiency anemia, unspecified: Secondary | ICD-10-CM | POA: Insufficient documentation

## 2022-01-26 NOTE — Telephone Encounter (Signed)
Contacted pt to schedule an appt. Unable to reach via phone, voicemail was left.   Scheduling Message Entered by Rosanne Sack A on 01/26/2022 at  8:13 AM Priority: High <No visit type provided>  Department: [facility]  Provider:   Scheduling Notes:  Please schedule B12 injections weekly for 4 weeks then every 4 weeks as soon as infusion has opening. Thank you

## 2022-01-26 NOTE — Telephone Encounter (Signed)
Message left for patient regarding labs results and need for injections, ask patient to call back to schedule injections.

## 2022-01-26 NOTE — Telephone Encounter (Signed)
-----   Message from Marvia Pickles, PA-C sent at 01/26/2022  8:11 AM EDT ----- Please let her know her B12 is very low. I recommend she start B12 injections, weekly for 4 weeks, then every 4 weeks indefinitely. I will put in orders and ask scheduling to call her with appointments. Thank you

## 2022-02-01 ENCOUNTER — Inpatient Hospital Stay: Payer: PPO | Attending: Oncology

## 2022-02-01 DIAGNOSIS — Z23 Encounter for immunization: Secondary | ICD-10-CM | POA: Insufficient documentation

## 2022-02-01 DIAGNOSIS — D519 Vitamin B12 deficiency anemia, unspecified: Secondary | ICD-10-CM | POA: Insufficient documentation

## 2022-02-01 DIAGNOSIS — C186 Malignant neoplasm of descending colon: Secondary | ICD-10-CM | POA: Insufficient documentation

## 2022-02-01 DIAGNOSIS — C772 Secondary and unspecified malignant neoplasm of intra-abdominal lymph nodes: Secondary | ICD-10-CM | POA: Insufficient documentation

## 2022-02-02 ENCOUNTER — Other Ambulatory Visit (HOSPITAL_COMMUNITY): Payer: Self-pay

## 2022-02-03 ENCOUNTER — Encounter: Payer: Self-pay | Admitting: Hematology and Oncology

## 2022-02-04 NOTE — Progress Notes (Signed)
  [place]  Telephone: [phone]  Referring oncologist: Hosie Poisson MD    HISTORY OF PRESENT ILLNESS: Jenna Hutchinson is a 81 year old female currently receiving capecitabine for her stage IIIC colon cancer that was diagnosed may 2023. She is here in clinic in the middle of her two weeks of xeloda to make sure her hands and feet are not worsening or getting more painful.  LABS:  Latest Reference Range & Units 01/25/22 00:00  Sodium 137 - 147  136 ! (E)  Potassium 3.5 - 5.1 mEq/L 4.1 (E)  Chloride 99 - 108  108 (E)  CO2 13 - 22  23 ! (E)  Glucose  93 (E)  BUN 4 - 21  23 ! (E)  Creatinine 0.5 - 1.1  1.3 ! (E)  Calcium 8.7 - 10.7  9.7 (E)  Alkaline Phosphatase 25 - 125  87 (E)  Albumin 3.5 - 5.0  3.8 (E)  AST 13 - 35  29 (E)  ALT 7 - 35 U/L 24 (E)  Bilirubin, Total  0.6 (E)  WBC  9.0 (E)  RBC 3.87 - 5.11  2.96 ! (E)  Hemoglobin 12.0 - 16.0  10.7 ! (E)  HCT 36 - 46  31 ! (E)  MCV 81 - 99  104 ! (E)  Platelets 150 - 400 K/uL 292 (E)    ASSESSMENT & PLAN: Patient states that the pain has gone away now that she has started to do foot soaks every 3-4 days. She states that this has helped a ton and she has not had any pain in her feet since starting it. She is using moisturizer 4-5x per day although she still has some cracking on both her hands and feet. This has not worsened since last week when she saw [facility]. Patient states that she has been taking her shoes off more but there are still spots that are dry. We discussed other ways to possibly keep her feet more moisturized.  Patient states that now that the pain is gone that it is much more manageable.   Drema Halon, PharmD Hematology/Oncology Clinical Pharmacist [facility] [phone]

## 2022-02-06 ENCOUNTER — Inpatient Hospital Stay: Payer: PPO

## 2022-02-06 VITALS — BP 134/63 | HR 59 | Temp 98.9°F | Resp 18 | Ht 61.7 in | Wt 137.1 lb

## 2022-02-06 DIAGNOSIS — D518 Other vitamin B12 deficiency anemias: Secondary | ICD-10-CM

## 2022-02-06 DIAGNOSIS — Z23 Encounter for immunization: Secondary | ICD-10-CM | POA: Diagnosis not present

## 2022-02-06 DIAGNOSIS — C186 Malignant neoplasm of descending colon: Secondary | ICD-10-CM | POA: Diagnosis not present

## 2022-02-06 DIAGNOSIS — C772 Secondary and unspecified malignant neoplasm of intra-abdominal lymph nodes: Secondary | ICD-10-CM | POA: Diagnosis not present

## 2022-02-06 DIAGNOSIS — D519 Vitamin B12 deficiency anemia, unspecified: Secondary | ICD-10-CM | POA: Diagnosis not present

## 2022-02-06 MED ORDER — CYANOCOBALAMIN 1000 MCG/ML IJ SOLN
1000.0000 ug | Freq: Once | INTRAMUSCULAR | Status: AC
  Administered 2022-02-06: 1000 ug via INTRAMUSCULAR
  Filled 2022-02-06: qty 1

## 2022-02-06 MED ORDER — INFLUENZA VAC A&B SA ADJ QUAD 0.5 ML IM PRSY
0.5000 mL | PREFILLED_SYRINGE | Freq: Once | INTRAMUSCULAR | Status: AC
  Administered 2022-02-06: 0.5 mL via INTRAMUSCULAR
  Filled 2022-02-06: qty 0.5

## 2022-02-06 NOTE — Patient Instructions (Signed)
Vitamin B12 Deficiency Vitamin B12 deficiency occurs when the body does not have enough of this important vitamin. The body needs this vitamin: To make red blood cells. To make DNA. This is the genetic material inside cells. To help the nerves work properly so they can carry messages from the brain to the body. Vitamin B12 deficiency can cause health problems, such as not having enough red blood cells in the blood (anemia). This can lead to nerve damage if untreated. What are the causes? This condition may be caused by: Not eating enough foods that contain vitamin B12. Not having enough stomach acid and digestive fluids to properly absorb vitamin B12 from the food that you eat. Having certain diseases that make it hard to absorb vitamin B12. These diseases include Crohn's disease, chronic pancreatitis, and cystic fibrosis. An autoimmune disorder in which the body does not make enough of a protein (intrinsic factor) within the stomach, resulting in not enough absorption of vitamin B12. Having a surgery in which part of the stomach or small intestine is removed. Taking certain medicines that make it hard for the body to absorb vitamin B12. These include: Heartburn medicines, such as antacids and proton pump inhibitors. Some medicines that are used to treat diabetes. What increases the risk? The following factors may make you more likely to develop a vitamin B12 deficiency: Being an older adult. Eating a vegetarian or vegan diet that does not include any foods that come from animals. Eating a poor diet while you are pregnant. Taking certain medicines. Having alcoholism. What are the signs or symptoms? In some cases, there are no symptoms of this condition. If the condition leads to anemia or nerve damage, various symptoms may occur, such as: Weakness. Tiredness (fatigue). Loss of appetite. Numbness or tingling in your hands and feet. Redness and burning of the tongue. Depression,  confusion, or memory problems. Trouble walking. If anemia is severe, symptoms can include: Shortness of breath. Dizziness. Rapid heart rate. How is this diagnosed? This condition may be diagnosed with a blood test to measure the level of vitamin B12 in your blood. You may also have other tests, including: A group of tests that measure certain characteristics of blood cells (complete blood count, CBC). A blood test to measure intrinsic factor. A procedure where a thin tube with a camera on the end is used to look into your stomach or intestines (endoscopy). Other tests may be needed to discover the cause of the deficiency. How is this treated? Treatment for this condition depends on the cause. This condition may be treated by: Changing your eating and drinking habits, such as: Eating more foods that contain vitamin B12. Drinking less alcohol or no alcohol. Getting vitamin B12 injections. Taking vitamin B12 supplements by mouth (orally). Your health care provider will tell you which dose is best for you. Follow these instructions at home: Eating and drinking  Include foods in your diet that come from animals and contain a lot of vitamin B12. These include: Meats and poultry. This includes beef, pork, chicken, turkey, and organ meats, such as liver. Seafood. This includes clams, rainbow trout, salmon, tuna, and haddock. Eggs. Dairy foods such as milk, yogurt, and cheese. Eat foods that have vitamin B12 added to them (are fortified), such as ready-to-eat breakfast cereals. Check the label on the package to see if a food is fortified. The items listed above may not be a complete list of foods and beverages you can eat and drink. Contact a dietitian for   more information. Alcohol use Do not drink alcohol if: Your health care provider tells you not to drink. You are pregnant, may be pregnant, or are planning to become pregnant. If you drink alcohol: Limit how much you have to: 0-1 drink a  day for women. 0-2 drinks a day for men. Know how much alcohol is in your drink. In the [place] one drink equals one 12 oz bottle of beer (355 mL), one 5 oz glass of wine (148 mL), or one 1 oz glass of hard liquor (44 mL). General instructions Get vitamin B12 injections if told to by your health care provider. Take supplements only as told by your health care provider. Follow the directions carefully. Keep all follow-up visits. This is important. Contact a health care provider if: Your symptoms come back. Your symptoms get worse or do not improve with treatment. Get help right away: You develop shortness of breath. You have a rapid heart rate. You have chest pain. You become dizzy or you faint. These symptoms may be an emergency. Get help right away. Call 911. Do not wait to see if the symptoms will go away. Do not drive yourself to the hospital. Summary Vitamin B12 deficiency occurs when the body does not have enough of this important vitamin. Common causes include not eating enough foods that contain vitamin B12, not being able to absorb vitamin B12 from the food that you eat, having a surgery in which part of the stomach or small intestine is removed, or taking certain medicines. Eat foods that have vitamin B12 in them. Treatment may include making a change in the way you eat and drink, getting vitamin B12 injections, or taking vitamin B12 supplements. This information is not intended to replace advice given to you by your health care provider. Make sure you discuss any questions you have with your health care provider. Document Revised: 11/12/2020 Document Reviewed: 11/12/2020 Elsevier Patient Education  [place]. Influenza Vaccine Injection What is this medication? INFLUENZA VACCINE (in floo EN zuh vak SEEN) reduces the risk of the influenza (flu). It does not treat influenza. It is still possible to get influenza after receiving this vaccine, but the symptoms may be less  severe or not last as long. It works by helping your immune system learn how to fight off a future infection. This medicine may be used for other purposes; ask your health care provider or pharmacist if you have questions. COMMON BRAND NAME(S): Afluria, Afluria Quadrivalent, Agriflu, Alfuria, FLUAD, FLUAD Quadrivalent, Fluarix, Fluarix Quadrivalent, Flublok, Flublok Quadrivalent, FLUCELVAX, FLUCELVAX Quadrivalent, Flulaval, Flulaval Quadrivalent, Fluvirin, Fluzone, Fluzone High-Dose, Fluzone Intradermal, Fluzone Quadrivalent What should I tell my care team before I take this medication? They need to know if you have any of these conditions: Bleeding disorder like hemophilia Fever or infection Guillain-Barre syndrome or other neurological problems Immune system problems Infection with the human immunodeficiency virus (HIV) or AIDS Low blood platelet counts Multiple sclerosis An unusual or allergic reaction to influenza virus vaccine, latex, other medications, foods, dyes, or preservatives. Different brands of vaccines contain different allergens. Some may contain latex or eggs. Talk to your care team about your allergies to make sure that you get the right vaccine. Pregnant or trying to get pregnant Breastfeeding How should I use this medication? This vaccine is injected into a muscle or under the skin. It is given by your care team. A copy of Vaccine Information Statements will be given before each vaccination. Be sure to read this sheet carefully each  time. This sheet may change often. Talk to your care team to see which vaccines are right for you. Some vaccines should not be used in all age groups. Overdosage: If you think you have taken too much of this medicine contact a poison control center or emergency room at once. NOTE: This medicine is only for you. Do not share this medicine with others. What if I miss a dose? This does not apply. What may interact with this medication? Certain  medications that lower your immune system, such as etanercept, anakinra, infliximab, adalimumab Certain medications that prevent or treat blood clots, such as warfarin Chemotherapy or radiation therapy Phenytoin Steroid medications, such as prednisone or cortisone Theophylline Vaccines This list may not describe all possible interactions. Give your health care provider a list of all the medicines, herbs, non-prescription drugs, or dietary supplements you use. Also tell them if you smoke, drink alcohol, or use illegal drugs. Some items may interact with your medicine. What should I watch for while using this medication? Report any side effects that do not go away with your care team. Call your care team if any unusual symptoms occur within 6 weeks of receiving this vaccine. You may still catch the flu, but the illness is not usually as bad. You cannot get the flu from the vaccine. The vaccine will not protect against colds or other illnesses that may cause fever. The vaccine is needed every year. What side effects may I notice from receiving this medication? Side effects that you should report to your care team as soon as possible: Allergic reactions--skin rash, itching, hives, swelling of the face, lips, tongue, or throat Side effects that usually do not require medical attention (report these to your care team if they continue or are bothersome): Chills Fatigue Headache Joint pain Loss of appetite Muscle pain Nausea Pain, redness, or irritation at injection site This list may not describe all possible side effects. Call your doctor for medical advice about side effects. You may report side effects to FDA at 1-800-FDA-1088. Where should I keep my medication? The vaccine is only given by your care team. It will not be stored at home. NOTE: This sheet is a summary. It may not cover all possible information. If you have questions about this medicine, talk to your doctor, pharmacist, or health  care provider.  2023 Elsevier/Gold Standard (2007-05-11 00:00:00)

## 2022-02-08 ENCOUNTER — Other Ambulatory Visit (HOSPITAL_COMMUNITY): Payer: Self-pay

## 2022-02-09 DIAGNOSIS — M810 Age-related osteoporosis without current pathological fracture: Secondary | ICD-10-CM | POA: Diagnosis not present

## 2022-02-14 ENCOUNTER — Inpatient Hospital Stay: Payer: PPO

## 2022-02-14 VITALS — BP 123/61 | HR 60 | Resp 18 | Ht 61.7 in | Wt 138.0 lb

## 2022-02-14 DIAGNOSIS — D518 Other vitamin B12 deficiency anemias: Secondary | ICD-10-CM

## 2022-02-14 DIAGNOSIS — D519 Vitamin B12 deficiency anemia, unspecified: Secondary | ICD-10-CM | POA: Diagnosis not present

## 2022-02-14 MED ORDER — CYANOCOBALAMIN 1000 MCG/ML IJ SOLN
1000.0000 ug | Freq: Once | INTRAMUSCULAR | Status: AC
  Administered 2022-02-14: 1000 ug via INTRAMUSCULAR
  Filled 2022-02-14: qty 1

## 2022-02-14 NOTE — Patient Instructions (Signed)

## 2022-02-15 ENCOUNTER — Other Ambulatory Visit: Payer: Self-pay

## 2022-02-15 ENCOUNTER — Inpatient Hospital Stay (INDEPENDENT_AMBULATORY_CARE_PROVIDER_SITE_OTHER): Payer: PPO | Admitting: Oncology

## 2022-02-15 ENCOUNTER — Ambulatory Visit: Payer: PPO | Admitting: Oncology

## 2022-02-15 ENCOUNTER — Encounter: Payer: Self-pay | Admitting: Hematology and Oncology

## 2022-02-15 ENCOUNTER — Inpatient Hospital Stay: Payer: PPO

## 2022-02-15 ENCOUNTER — Encounter: Payer: Self-pay | Admitting: Oncology

## 2022-02-15 ENCOUNTER — Other Ambulatory Visit: Payer: Self-pay | Admitting: Oncology

## 2022-02-15 VITALS — BP 141/65 | HR 55 | Temp 97.5°F | Resp 18 | Ht 61.7 in | Wt 137.6 lb

## 2022-02-15 DIAGNOSIS — C772 Secondary and unspecified malignant neoplasm of intra-abdominal lymph nodes: Secondary | ICD-10-CM

## 2022-02-15 DIAGNOSIS — D649 Anemia, unspecified: Secondary | ICD-10-CM | POA: Diagnosis not present

## 2022-02-15 DIAGNOSIS — D510 Vitamin B12 deficiency anemia due to intrinsic factor deficiency: Secondary | ICD-10-CM | POA: Diagnosis not present

## 2022-02-15 DIAGNOSIS — D519 Vitamin B12 deficiency anemia, unspecified: Secondary | ICD-10-CM | POA: Diagnosis not present

## 2022-02-15 DIAGNOSIS — C186 Malignant neoplasm of descending colon: Secondary | ICD-10-CM

## 2022-02-15 LAB — CMP (CANCER CENTER ONLY)
ALT: 16 U/L (ref 0–44)
AST: 28 U/L (ref 15–41)
Albumin: 3.7 g/dL (ref 3.5–5.0)
Alkaline Phosphatase: 62 U/L (ref 38–126)
Anion gap: 4 — ABNORMAL LOW (ref 5–15)
BUN: 22 mg/dL (ref 8–23)
CO2: 24 mmol/L (ref 22–32)
Calcium: 8.8 mg/dL — ABNORMAL LOW (ref 8.9–10.3)
Chloride: 112 mmol/L — ABNORMAL HIGH (ref 98–111)
Creatinine: 1.23 mg/dL — ABNORMAL HIGH (ref 0.44–1.00)
GFR, Estimated: 44 mL/min — ABNORMAL LOW (ref >60)
Glucose, Bld: 98 mg/dL (ref 70–99)
Potassium: 4.6 mmol/L (ref 3.5–5.1)
Sodium: 140 mmol/L (ref 135–145)
Total Bilirubin: 0.8 mg/dL (ref 0.3–1.2)
Total Protein: 6.2 g/dL — ABNORMAL LOW (ref 6.5–8.1)

## 2022-02-15 LAB — CBC AND DIFFERENTIAL
HCT: 31 — AB (ref 36–46)
Hemoglobin: 10.6 — AB (ref 12.0–16.0)
Neutrophils Absolute: 3.53
Platelets: 212 10*3/uL (ref 150–400)
WBC: 5.6

## 2022-02-15 LAB — CBC: RBC: 2.96 — AB (ref 3.87–5.11)

## 2022-02-15 NOTE — Progress Notes (Signed)
Jenna Hutchinson  [address] 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  She underwent surgical resection.  She is receiving adjuvant oral chemotherapy with capecitibine 1000 mg twice daily for 14 days every 21 days.  She continues to tolerate this fairly well.  She is having desquamation and intermittent pain of both feet.  She is using moisturizers regularly.  Her shoes are pointed so fit quite tight in the toes.  I recommended she avoid wearing them as much as possible.  She states these are the only shoes that fit her..  She will proceed with her 81th cycle at this time.  We will see her back in 3 weeks for repeat clinical assessment prior to her 8th cycle.   Iron deficiency anemia due to chronic blood loss She has not been taking her oral iron supplement regularly.    Macrocytic anemia Her anemia is now macrocytic, so unlikely due to iron deficiency. Her hemoglobin is slowly dropping, down to 10.6 now. Folate was normal but B12 was quite low at 166 so she has been place on B12 injections weekly. She has had 2 so far, and does tell me her mother had to have B12 injections.      She will proceed with her capecitabine 7th cycle and have 2 more weekly B12 injections. I will see her back in 3 weeks with CBC and CMP for her 8th cycle, and then coordinate her visits with her B12 injections monthly. The patient understands the plans discussed today and is in agreement with them.  She knows to contact our office if she develops concerns prior to her next appointment.    Derwood Kaplan, MD  [facility] [address] [place] [address] Dept: [phone] Dept Fax:  [phone]   No orders of the defined types were placed in this encounter.     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 ([place])  08/11/2021 Cancer Staging   Staging form: Colon and Rectum, AJCC 8th Edition - Clinical stage from 08/11/2021: Stage IIIC (cT3, cN2b, cM0) - Signed by Derwood Kaplan, MD on 09/14/2021 Histopathologic type: Adenocarcinoma, NOS Stage prefix: Initial diagnosis Total positive nodes: 12 Total nodes examined: 32 Histologic grade (G): G3 Histologic grading system: 4 grade system Laterality: Left Tumor size (mm): 40 Lymph-vascular invasion (LVI): LVI present/identified, NOS Diagnostic confirmation: Positive histology Specimen type: Excision Staged by: Managing physician Tumor deposits (TD): Absent Carcinoembryonic antigen (CEA) (ng/mL): 3.6 Perineural invasion (PNI): Unknown Microsatellite instability (MSI): Stable KRAS mutation: Not assessed NRAS mutation: Not assessed BRAF mutation: Not assessed Stage used in treatment planning: Yes National guidelines used in treatment planning: Yes Type of national guideline used in treatment planning: NCCN Staging comments: Over 81 years old, will use capecitabine at reduced dose as tolerated   08/25/2021 Initial Diagnosis   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  She continues capecitabine 1000 mg twice daily for 2 weeks on and 1 week off.  She states she continues to tolerate this fairly well.  She has been having desquamation of the feet, as well as intermittent pain, but is not severe.  Her  hands have been dry but not peeling.  She is using moisturizer regularly.  She has intermittent upper abdominal pain and increased gas, for which she is using simethicone as needed. Her creatinine is mildly elevated at 1.23, but improved from 1.3 last time. Her hemoglobin had slowly decreased and so B12 level was checked and was low at 166. She has been started on weekly B12 injections and has received 2 so far. After 4, we will go to monthly. Folate was normal. She denies fevers or chills. Her appetite is good. Her weight has been stable.  REVIEW OF SYSTEMS:  Review of Systems  Constitutional: Negative.  Negative for appetite change, chills, fatigue, fever and unexpected weight change.  HENT:  Negative.  Negative for lump/mass, mouth sores and sore throat.   Eyes: Negative.   Respiratory: Negative.  Negative for cough and shortness of breath.   Cardiovascular: Negative.  Negative for chest pain and leg swelling.  Gastrointestinal: Negative.  Negative for constipation, diarrhea, nausea and vomiting.  Endocrine: Negative.  Negative for hot flashes.  Genitourinary: Negative.  Negative for difficulty urinating, dysuria, frequency and hematuria.   Musculoskeletal: Negative.  Negative for arthralgias, back pain and myalgias.  Skin: Negative.  Negative for rash.  Neurological: Negative.  Negative for dizziness and headaches.  Hematological: Negative.  Negative for adenopathy. Does not bruise/bleed easily.  Psychiatric/Behavioral: Negative.  Negative for depression and sleep disturbance. The patient is not nervous/anxious.      VITALS:  Blood pressure (!) 141/65, pulse (!) 55, temperature (!) 97.5 F (36.4 C), temperature source Oral, resp. rate 18, height 5' 1.7" (1.567 m), weight 137 lb 9.6 oz (62.4 kg), SpO2 100 %.  Wt Readings from Last 3 Encounters:  02/28/22 137 lb 0.6 oz (62.2 kg)  02/21/22 136 lb 1.9 oz (61.7 kg)  02/15/22 137 lb 9.6 oz (62.4 kg)    Body mass index is 25.41  kg/m.  Performance status (ECOG): 1 - Symptomatic but completely ambulatory  PHYSICAL EXAM:  Physical Exam Vitals and nursing note reviewed.  Constitutional:      General: She is not in acute distress.    Appearance: Normal appearance.  HENT:     Head: Normocephalic and atraumatic.     Mouth/Throat:     Mouth: Mucous membranes are moist.     Pharynx: Oropharynx is clear. No oropharyngeal exudate or posterior oropharyngeal erythema.  Eyes:     General: No scleral icterus.    Extraocular Movements: Extraocular movements intact.     Conjunctiva/sclera: Conjunctivae normal.     Pupils: Pupils are equal, round, and reactive to light.  Cardiovascular:     Rate and Rhythm: Normal rate and regular rhythm.     Heart sounds: Normal heart sounds. No murmur heard.    No friction rub. No gallop.  Pulmonary:     Effort: Pulmonary effort is normal.     Breath sounds: Normal breath sounds. No wheezing, rhonchi or rales.  Abdominal:     General: There is no distension.     Palpations: Abdomen is soft. There is no hepatomegaly, splenomegaly or mass.     Tenderness: There is no abdominal tenderness.  Musculoskeletal:        General: Normal range of motion.     Cervical back: Normal range of motion and neck supple. No tenderness.     Right lower leg: No edema.     Left lower leg: No edema.  Lymphadenopathy:     Cervical: No cervical adenopathy.     Upper Body:     Right upper body: No supraclavicular or axillary adenopathy.     Left upper body: No supraclavicular or axillary adenopathy.     Lower Body: No right inguinal adenopathy. No left inguinal adenopathy.  Skin:    General: Skin is warm and dry.     Coloration: Skin is not jaundiced.     Findings: No rash.     Comments: Moderate dry desquamation of the bilateral plantar surfaces of feet, with mild erythema.  There is no pain on palpation.  Neurological:     Mental Status: She is alert and oriented to person, place, and time.      Cranial Nerves: No cranial nerve deficit.  Psychiatric:        Mood and Affect: Mood normal.        Behavior: Behavior normal.        Thought Content: Thought content normal.     LABS:      Latest Ref Rng & Units 02/15/2022   12:00 AM 01/25/2022   12:00 AM 01/02/2022   12:00 AM  CBC  WBC  5.6     9.0     5.9      Hemoglobin 12.0 - 16.0 10.6     10.7     10.6      Hematocrit 36 - 46 _0 Platelets 150 - 400 K/uL 212     292     190         This result is from an external source.      Latest Ref Rng & Units 02/15/2022    1:08 PM 01/25/2022   12:00 AM 01/02/2022   12:00 AM  CMP  Glucose 70 - 99 mg/dL 98     BUN 8 - 23 mg/dL _1 Creatinine 0.44 - 1.00 mg/dL 1.23  1.3     1.1      Sodium 135 - 145 mmol/L 140  136     138      Potassium 3.5 - 5.1 mmol/L 4.6  4.1     4.2      Chloride 98 - 111 mmol/L 112  108     110      CO2 22 - 32 mmol/L _2 Calcium 8.9 - 10.3 mg/dL 8.8  9.7     9.4      Total Protein 6.5 - 8.1 g/dL 6.2     Total Bilirubin 0.3 - 1.2 mg/dL 0.8     Alkaline Phos 38 - 126 U/L 62  87     71      AST 15 - 41 U/L 28  29     37      ALT 0 - 44 U/L _3 This result is from an external source.     Lab Results  Component Value  Date   CEA1 3.6 08/26/2021   /  CEA  Date Value Ref Range Status  08/26/2021 3.6 0.0 - 4.7 ng/mL Final    Comment:    (NOTE)                             Nonsmokers          <3.9                             Smokers             <5.6 Roche [employer] Electrochemiluminescence Immunoassay (ECLIA) Values obtained with different assay methods or kits cannot be used interchangeably.  Results cannot be interpreted as absolute evidence of the presence or absence of malignant disease. Performed At: [facility] [place], [place] [id number] Rush Farmer MD OV:[id number]    No results found for: "PSA1" No results found for: "[id number]" No results  found for: "CAN125"  No results found for: "TOTALPROTELP", "ALBUMINELP", "A1GS", "A2GS", "BETS", "BETA2SER", "GAMS", "MSPIKE", "SPEI" No results found for: "TIBC", "FERRITIN", "IRONPCTSAT" No results found for: "LDH"  STUDIES:  No results found.    HISTORY:   Past Medical History:  Diagnosis Date   Arthritis    B12 deficiency anemia 01/26/2022   HTN (hypertension)    Hypothyroidism    Osteoporosis     Past Surgical History:  Procedure Laterality Date   BLADDER SURGERY     COLON SURGERY     eyelid surgery      Family History  Problem Relation Age of Onset   Colon cancer Mother    Prostate cancer Brother     Social History:  reports that she has never smoked. She has never used smokeless tobacco. She reports that she does not drink alcohol and does not use drugs.The patient is alone today.  Allergies: No Known Allergies  Current Medications: Current Outpatient Medications  Medication Sig Dispense Refill   aspirin 81 MG chewable tablet Chew by mouth.     capecitabine (XELODA) 500 MG tablet Take 2 tablets (1,000 mg total) by mouth 2 (two) times daily after a meal. Take for 14 days on, 7 days off. Repeat every 21 days. 56 tablet 5   carvedilol (COREG) 3.125 MG tablet Take 3.125 mg by mouth 2 (two) times daily.     Cholecalciferol 25 MCG (1000 UT) capsule Take by mouth.     fluticasone (FLONASE) 50 MCG/ACT nasal spray Place 2 sprays into both nostrils daily.     furosemide (LASIX) 20 MG tablet Take 20 mg by mouth daily.     hydroxychloroquine (PLAQUENIL) 200 MG tablet Take 200 mg by mouth 2 (two) times daily.     losartan (COZAAR) 25 MG tablet Take 25 mg by mouth daily.     nitroGLYCERIN (NITROSTAT) 0.4 MG SL tablet Place under the tongue.     predniSONE (DELTASONE) 10 MG tablet Take 10 mg by mouth daily as needed.     PROLIA 60 MG/ML SOSY injection Inject into the skin.     rosuvastatin (CRESTOR) 20 MG tablet Take 1 tablet by mouth daily.     SYNTHROID 125 MCG tablet  Take 125 mcg by mouth daily.     Zinc Gluconate 50 MG CAPS Take 1 tablet by mouth daily. (Patient not taking: Reported on 01/25/2022)     No current facility-administered medications for this visit.

## 2022-02-21 ENCOUNTER — Inpatient Hospital Stay: Payer: PPO

## 2022-02-21 VITALS — BP 128/69 | HR 57 | Temp 98.1°F | Resp 18 | Ht 61.7 in | Wt 136.1 lb

## 2022-02-21 DIAGNOSIS — D519 Vitamin B12 deficiency anemia, unspecified: Secondary | ICD-10-CM | POA: Diagnosis not present

## 2022-02-21 DIAGNOSIS — D518 Other vitamin B12 deficiency anemias: Secondary | ICD-10-CM

## 2022-02-21 MED ORDER — CYANOCOBALAMIN 1000 MCG/ML IJ SOLN
1000.0000 ug | Freq: Once | INTRAMUSCULAR | Status: AC
  Administered 2022-02-21: 1000 ug via INTRAMUSCULAR
  Filled 2022-02-21: qty 1

## 2022-02-24 ENCOUNTER — Other Ambulatory Visit (HOSPITAL_COMMUNITY): Payer: Self-pay

## 2022-02-28 ENCOUNTER — Other Ambulatory Visit (HOSPITAL_COMMUNITY): Payer: Self-pay

## 2022-02-28 ENCOUNTER — Inpatient Hospital Stay: Payer: PPO

## 2022-02-28 VITALS — BP 150/61 | HR 61 | Temp 97.7°F | Resp 18 | Wt 137.0 lb

## 2022-02-28 DIAGNOSIS — D518 Other vitamin B12 deficiency anemias: Secondary | ICD-10-CM

## 2022-02-28 DIAGNOSIS — D519 Vitamin B12 deficiency anemia, unspecified: Secondary | ICD-10-CM | POA: Diagnosis not present

## 2022-02-28 MED ORDER — CYANOCOBALAMIN 1000 MCG/ML IJ SOLN
1000.0000 ug | Freq: Once | INTRAMUSCULAR | Status: AC
  Administered 2022-02-28: 1000 ug via INTRAMUSCULAR
  Filled 2022-02-28: qty 1

## 2022-02-28 NOTE — Patient Instructions (Signed)

## 2022-03-01 ENCOUNTER — Other Ambulatory Visit (HOSPITAL_COMMUNITY): Payer: Self-pay

## 2022-03-04 ENCOUNTER — Encounter: Payer: Self-pay | Admitting: Hematology and Oncology

## 2022-03-07 DIAGNOSIS — C186 Malignant neoplasm of descending colon: Secondary | ICD-10-CM | POA: Diagnosis not present

## 2022-03-07 NOTE — Progress Notes (Signed)
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  She underwent surgical resection.  She is receiving adjuvant oral chemotherapy with capecitibine 1000 mg twice daily for 14 days every 21 days.  She continues to tolerate this fairly well.  She has been having desquamation and intermittent pain of both feet.  She is using moisturizers regularly, as well as soaking.  She will proceed with an 8th and final cycle of capecitabine.  We will plan to see her back in 4 weeks with a CBC, comprehensive metabolic panel and CEA for repeat clinical assessment.  We will then go to every 74-monthfollow-up.  Iron deficiency anemia due to chronic blood loss She had not been taking her oral iron supplement regularly and had worsening anemia which was macrocytic, so unlikely due to iron deficiency. She was found to have B12 deficiency and was started on B12 injections.  B12 deficiency anemia B12 was quite low at 166 in November.  She is receiving B12 injections and completed 4 weekly doses.  She will continue B12 every 4 weeks.   The patient understands the plans discussed today and is in agreement with them.  She knows to contact our office if she develops concerns prior to her next appointment.   I provided 15 minutes of face-to-face time during this encounter and > 50% was spent counseling as documented under my assessment and plan.    KMarvia Pickles PA-C  [facility]AT [facility]SExeterNAl[place]Dept: [phone] Fax: [phone]  Orders Placed This Encounter  Procedures   CBC with Differential ([place])    Standing Status:   Future     Standing Expiration Date:   03/09/2023   CMP ([place])    Standing Status:   Future    Standing Expiration Date:   03/09/2023   CEA    Standing Status:   Future    Standing Expiration Date:   03/09/2023      CHIEF COMPLAINT:  CC: Stage IIIC cancer  Current Treatment: Adjuvant capecitabine  HISTORY OF PRESENT ILLNESS:   Oncology History  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 ([place]  08/11/2021 Cancer Staging   Staging form: Colon and Rectum, AJCC 8th Edition - Clinical stage from 08/11/2021: Stage IIIC (cT3, cN2b, cM0) - Signed by MDerwood Kaplan MD on 09/14/2021 Histopathologic type: Adenocarcinoma, NOS Stage prefix: Initial diagnosis Total positive nodes: 12 Total nodes examined: 32 Histologic grade (G): G3 Histologic grading system: 4 grade system Laterality: Left Tumor size (mm): 635Lymph-vascular invasion (LVI): LVI present/identified, NOS Diagnostic confirmation: Positive histology Specimen type: Excision Staged by: Managing physician Tumor deposits (TD): Absent Carcinoembryonic antigen (CEA) (ng/mL): 3.6 Perineural invasion (PNI): Unknown Microsatellite instability (MSI): Stable KRAS mutation: Not assessed NRAS mutation: Not assessed BRAF mutation: Not assessed Stage used in treatment planning: Yes National guidelines used in treatment planning: Yes Type of national guideline used in treatment planning: NCCN Staging comments: Over 831years old, will use capecitabine at reduced dose as tolerated   08/25/2021 Initial Diagnosis   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  She reports diarrhea, but has not tried Imodium.  She states her feet remain  irritated, but not peeling or extremely painful.  She continues to use moisturizer and soaks.  She reports both feet are swelling.  She has furosemide to use as needed.  She denies fevers or chills. She denies pain. Her appetite is good, but she is eating several small meals a day. Her weight has decreased 5 pounds over last 3 weeks .  REVIEW OF SYSTEMS:  Review of Systems  Constitutional:  Negative for appetite change, chills, fatigue, fever and unexpected weight change.  HENT:   Negative for lump/mass, mouth sores and sore throat.   Respiratory:  Negative for cough and shortness of breath.   Cardiovascular:  Positive for leg swelling. Negative for chest pain.  Gastrointestinal:  Positive for diarrhea. Negative for abdominal pain, constipation, nausea and vomiting.  Endocrine: Negative for hot flashes.  Genitourinary:  Negative for difficulty urinating, dysuria, frequency and hematuria.   Musculoskeletal:  Negative for arthralgias, back pain and myalgias.  Skin:  Negative for rash.  Neurological:  Negative for dizziness and headaches.  Hematological:  Negative for adenopathy. Does not bruise/bleed easily.  Psychiatric/Behavioral:  Negative for depression and sleep disturbance. The patient is not nervous/anxious.      VITALS:  Blood pressure 135/76, pulse (!) 53, temperature (!) 97.4 F (36.3 C), temperature source Oral, resp. rate 20, height 5' 1.7" (1.567 m), weight 132 lb 3.2 oz (60 kg).  Wt Readings from Last 3 Encounters:  03/08/22 132 lb 3.2 oz (60 kg)  02/28/22 137 lb 0.6 oz (62.2 kg)  02/21/22 136 lb 1.9 oz (61.7 kg)    Body mass index is 24.42 kg/m.  Performance status (ECOG): 1 - Symptomatic but completely ambulatory  PHYSICAL EXAM:  Physical Exam Vitals and nursing note reviewed.  Constitutional:      General: She is not in acute distress.    Appearance: Normal appearance.  HENT:     Head: Normocephalic and atraumatic.     Mouth/Throat:     Mouth: Mucous membranes  are moist.     Pharynx: Oropharynx is clear. No oropharyngeal exudate or posterior oropharyngeal erythema.  Eyes:     General: No scleral icterus.    Extraocular Movements: Extraocular movements intact.     Conjunctiva/sclera: Conjunctivae normal.     Pupils: Pupils are equal, round, and reactive to light.  Cardiovascular:     Rate and Rhythm: Normal rate and regular rhythm.     Heart sounds: Normal heart sounds. No murmur heard.    No friction rub. No gallop.  Pulmonary:     Effort: Pulmonary effort is normal.     Breath sounds: Normal breath sounds. No wheezing, rhonchi or rales.  Abdominal:     General: There is no distension.     Palpations: Abdomen is soft. There is no hepatomegaly, splenomegaly or mass.     Tenderness: There is no abdominal tenderness.  Musculoskeletal:        General: Normal range of motion.     Cervical back: Normal range of motion and neck supple. No tenderness.     Right lower leg: Edema (1+) present.     Left lower leg: Edema (1+) present.  Feet:  Comments: Moderate plantar erythema and thickening of the skin without open desquamation. Lymphadenopathy:     Cervical: No cervical adenopathy.     Upper Body:     Right upper body: No supraclavicular or axillary adenopathy.     Left upper body: No supraclavicular or axillary adenopathy.     Lower Body: No right inguinal adenopathy. No left inguinal adenopathy.  Skin:    General: Skin is warm and dry.     Coloration: Skin is not jaundiced.     Findings: No rash.  Neurological:     Mental Status: She is alert and oriented to person, place, and time.     Cranial Nerves: No cranial nerve deficit.  Psychiatric:        Mood and Affect: Mood normal.        Behavior: Behavior normal.        Thought Content: Thought content normal.    LABS:      Latest Ref Rng & Units 03/08/2022    1:22 PM 02/15/2022   12:00 AM 01/25/2022   12:00 AM  CBC  WBC 4.0 - 10.5 K/uL 6.7  5.6     9.0      Hemoglobin 12.0 -  15.0 g/dL 10.6  10.6     10.7      Hematocrit 36.0 - 46.0 % 33.0  31     31      Platelets 150 - 400 K/uL 248  212     292         This result is from an external source.      Latest Ref Rng & Units 03/08/2022    1:22 PM 02/15/2022    1:08 PM 01/25/2022   12:00 AM  CMP  Glucose 70 - 99 mg/dL 100  98    BUN 8 - 23 mg/dL _0 Creatinine 0.44 - 1.00 mg/dL 1.36  1.23  1.3      Sodium 135 - 145 mmol/L 142  140  136      Potassium 3.5 - 5.1 mmol/L 3.9  4.6  4.1      Chloride 98 - 111 mmol/L 109  112  108      CO2 22 - 32 mmol/L _1 Calcium 8.9 - 10.3 mg/dL 9.1  8.8  9.7      Total Protein 6.5 - 8.1 g/dL 6.3  6.2    Total Bilirubin 0.3 - 1.2 mg/dL 0.7  0.8    Alkaline Phos 38 - 126 U/L 62  62  87      AST 15 - 41 U/L _2 ALT 0 - 44 U/L _3 This result is from an external source.     Lab Results  Component Value Date   CEA1 3.6 08/26/2021   /  CEA  Date Value Ref Range Status  08/26/2021 3.6 0.0 - 4.7 ng/mL Final    Comment:    (NOTE)                             Nonsmokers          <3.9  Smokers             <5.6 Roche [employer] Electrochemiluminescence Immunoassay (ECLIA) Values obtained with different assay methods or kits cannot be used interchangeably.  Results cannot be interpreted as absolute evidence of the presence or absence of malignant disease. Performed At: [facility] [place], [place] [id number] Rush Farmer MD TG:[id number]    No results found for: "PSA1" No results found for: "[id number]" No results found for: "CAN125"  No results found for: "TOTALPROTELP", "ALBUMINELP", "A1GS", "A2GS", "BETS", "BETA2SER", "GAMS", "MSPIKE", "SPEI" No results found for: "TIBC", "FERRITIN", "IRONPCTSAT" No results found for: "LDH"  STUDIES:  No results found.    HISTORY:   Past Medical History:  Diagnosis Date   Arthritis    B12 deficiency anemia 01/26/2022   HTN  (hypertension)    Hypothyroidism    Osteoporosis     Past Surgical History:  Procedure Laterality Date   BLADDER SURGERY     COLON SURGERY     eyelid surgery      Family History  Problem Relation Age of Onset   Colon cancer Mother    Prostate cancer Brother     Social History:  reports that she has never smoked. She has never used smokeless tobacco. She reports that she does not drink alcohol and does not use drugs.The patient is alone today.  Allergies: No Known Allergies  Current Medications: Current Outpatient Medications  Medication Sig Dispense Refill   aspirin 81 MG chewable tablet Chew by mouth.     capecitabine (XELODA) 500 MG tablet Take 2 tablets (1,000 mg total) by mouth 2 (two) times daily after a meal. Take for 14 days on, 7 days off. Repeat every 21 days. 56 tablet 5   carvedilol (COREG) 3.125 MG tablet Take 3.125 mg by mouth 2 (two) times daily.     Cholecalciferol 25 MCG (1000 UT) capsule Take by mouth.     fluticasone (FLONASE) 50 MCG/ACT nasal spray Place 2 sprays into both nostrils daily.     furosemide (LASIX) 20 MG tablet Take 20 mg by mouth daily.     hydroxychloroquine (PLAQUENIL) 200 MG tablet Take 200 mg by mouth 2 (two) times daily.     losartan (COZAAR) 25 MG tablet Take 25 mg by mouth daily.     nitroGLYCERIN (NITROSTAT) 0.4 MG SL tablet Place under the tongue.     predniSONE (DELTASONE) 10 MG tablet Take 10 mg by mouth daily as needed.     PROLIA 60 MG/ML SOSY injection Inject into the skin.     rosuvastatin (CRESTOR) 20 MG tablet Take 1 tablet by mouth daily. (Patient not taking: Reported on 03/08/2022)     SYNTHROID 125 MCG tablet Take 125 mcg by mouth daily.     Zinc Gluconate 50 MG CAPS Take 1 tablet by mouth daily. (Patient not taking: Reported on 01/25/2022)     No current facility-administered medications for this visit.

## 2022-03-07 NOTE — Assessment & Plan Note (Signed)
Stage IIIC colon cancer diagnosed in May.  She underwent surgical resection.  She is receiving adjuvant oral chemotherapy with capecitibine 1000 mg twice daily for 14 days every 21 days.  She continues to tolerate this fairly well.  She is having desquamation and intermittent pain of both feet.  She is using moisturizers regularly.  Her shoes are pointed so fit quite tight in the toes.  I recommended she avoid wearing them as much as possible.  She states these are the only shoes that fit her..  She will proceed with her 7th cycle at this time.  We will see her back in 3 weeks for repeat clinical assessment prior to her 8th cycle.

## 2022-03-07 NOTE — Assessment & Plan Note (Signed)
B12 was quite low at 166 in November.  She is receiving B12 injections and completed 4 weekly doses.  She will continue B12 weeks.

## 2022-03-07 NOTE — Assessment & Plan Note (Signed)
She had not been taking her oral iron supplement regularly and had worsening anemia which was macrocytic, so unlikely due to iron deficiency. She was found to have B12 deficiency and was started on B12 injections.

## 2022-03-08 ENCOUNTER — Telehealth: Payer: Self-pay | Admitting: Oncology

## 2022-03-08 ENCOUNTER — Encounter: Payer: Self-pay | Admitting: Hematology and Oncology

## 2022-03-08 ENCOUNTER — Inpatient Hospital Stay: Payer: PPO

## 2022-03-08 ENCOUNTER — Inpatient Hospital Stay: Payer: PPO | Attending: Oncology | Admitting: Hematology and Oncology

## 2022-03-08 VITALS — BP 135/76 | HR 53 | Temp 97.4°F | Resp 20 | Ht 61.7 in | Wt 132.2 lb

## 2022-03-08 DIAGNOSIS — D500 Iron deficiency anemia secondary to blood loss (chronic): Secondary | ICD-10-CM | POA: Diagnosis not present

## 2022-03-08 DIAGNOSIS — C772 Secondary and unspecified malignant neoplasm of intra-abdominal lymph nodes: Secondary | ICD-10-CM | POA: Diagnosis not present

## 2022-03-08 DIAGNOSIS — C186 Malignant neoplasm of descending colon: Secondary | ICD-10-CM | POA: Diagnosis not present

## 2022-03-08 DIAGNOSIS — D519 Vitamin B12 deficiency anemia, unspecified: Secondary | ICD-10-CM | POA: Diagnosis not present

## 2022-03-08 LAB — CMP (CANCER CENTER ONLY)
ALT: 19 U/L (ref 0–44)
AST: 28 U/L (ref 15–41)
Albumin: 3.5 g/dL (ref 3.5–5.0)
Alkaline Phosphatase: 62 U/L (ref 38–126)
Anion gap: 8 (ref 5–15)
BUN: 20 mg/dL (ref 8–23)
CO2: 25 mmol/L (ref 22–32)
Calcium: 9.1 mg/dL (ref 8.9–10.3)
Chloride: 109 mmol/L (ref 98–111)
Creatinine: 1.36 mg/dL — ABNORMAL HIGH (ref 0.44–1.00)
GFR, Estimated: 39 mL/min — ABNORMAL LOW (ref >60)
Glucose, Bld: 100 mg/dL — ABNORMAL HIGH (ref 70–99)
Potassium: 3.9 mmol/L (ref 3.5–5.1)
Sodium: 142 mmol/L (ref 135–145)
Total Bilirubin: 0.7 mg/dL (ref 0.3–1.2)
Total Protein: 6.3 g/dL — ABNORMAL LOW (ref 6.5–8.1)

## 2022-03-08 LAB — CBC WITH DIFFERENTIAL (CANCER CENTER ONLY)
Abs Immature Granulocytes: 0.02 10*3/uL (ref 0.00–0.07)
Basophils Absolute: 0 10*3/uL (ref 0.0–0.1)
Basophils Relative: 1 %
Eosinophils Absolute: 0.1 10*3/uL (ref 0.0–0.5)
Eosinophils Relative: 1 %
HCT: 33 % — ABNORMAL LOW (ref 36.0–46.0)
Hemoglobin: 10.6 g/dL — ABNORMAL LOW (ref 12.0–15.0)
Immature Granulocytes: 0 %
Lymphocytes Relative: 23 %
Lymphs Abs: 1.6 10*3/uL (ref 0.7–4.0)
MCH: 35 pg — ABNORMAL HIGH (ref 26.0–34.0)
MCHC: 32.1 g/dL (ref 30.0–36.0)
MCV: 108.9 fL — ABNORMAL HIGH (ref 80.0–100.0)
Monocytes Absolute: 0.9 10*3/uL (ref 0.1–1.0)
Monocytes Relative: 14 %
Neutro Abs: 4.1 10*3/uL (ref 1.7–7.7)
Neutrophils Relative %: 61 %
Platelet Count: 248 10*3/uL (ref 150–400)
RBC: 3.03 MIL/uL — ABNORMAL LOW (ref 3.87–5.11)
RDW: 17.4 % — ABNORMAL HIGH (ref 11.5–15.5)
WBC Count: 6.7 10*3/uL (ref 4.0–10.5)
nRBC: 0 % (ref 0.0–0.2)

## 2022-03-08 NOTE — Telephone Encounter (Signed)
Patient has been scheduled for follow-up visit per 03/08/22 los. Pt given an appt calendar with date and time.  

## 2022-03-09 ENCOUNTER — Telehealth: Payer: Self-pay

## 2022-03-09 NOTE — Telephone Encounter (Signed)
Patient notified and voiced understanding.

## 2022-03-09 NOTE — Telephone Encounter (Signed)
-----   Message from Marvia Pickles, PA-C sent at 03/08/2022  5:09 PM EST ----- Please let her know labs look good, so continue chemo pill. Thanks

## 2022-03-20 ENCOUNTER — Other Ambulatory Visit (HOSPITAL_COMMUNITY): Payer: Self-pay

## 2022-03-21 ENCOUNTER — Telehealth: Payer: Self-pay

## 2022-03-21 ENCOUNTER — Other Ambulatory Visit: Payer: Self-pay

## 2022-03-21 ENCOUNTER — Other Ambulatory Visit (HOSPITAL_COMMUNITY): Payer: Self-pay

## 2022-03-21 NOTE — Telephone Encounter (Signed)
Pt called this morning to clarify her Xeloda dosage. This month she took Xeloda '500mg'$  po BID. She states she thought it was reduced to 1 tab daily (I don't see any documentation of this in office notes). However she has 14 tabs left. Today she is to start her 7 days off. Does she need to go ahead and take the remaining tabs or just skip those. Her f/u with Dr Hinton Rao is 04/05/2022.  I sent the above message to [facility] and [place].   Kelli,PA and Kaitlyn,RPH, both agree pt should NOT take the extra/remaining tabs. She should bring those in for [place] to destroy. This is her last cycle Xeloda.

## 2022-03-22 ENCOUNTER — Inpatient Hospital Stay (HOSPITAL_BASED_OUTPATIENT_CLINIC_OR_DEPARTMENT_OTHER): Payer: PPO

## 2022-03-22 ENCOUNTER — Other Ambulatory Visit (HOSPITAL_COMMUNITY): Payer: Self-pay

## 2022-03-22 DIAGNOSIS — C772 Secondary and unspecified malignant neoplasm of intra-abdominal lymph nodes: Secondary | ICD-10-CM

## 2022-03-22 DIAGNOSIS — C186 Malignant neoplasm of descending colon: Secondary | ICD-10-CM

## 2022-03-22 NOTE — Telephone Encounter (Signed)
Oral Oncology Pharmacist Encounter  Patient has completed 6 months of xeloda. Spoke to PA, [place] and she agreed we can remove the xeloda from the patients medication list. Medication discontinued as course is completed.   Drema Halon, PharmD Hematology/Oncology Clinical Pharmacist [facility] [phone]

## 2022-03-23 NOTE — Progress Notes (Signed)
  [place]  Telephone: [phone]  Referring oncologist: Hosie Poisson MD   HISTORY OF PRESENT ILLNESS: Jenna Hutchinson is a 81 year old female currently receiving capecitabine for her stage IIIC colon cancer that was diagnosed may 2023. She is here in clinic at the end of her 6 months of treatment to discuss extra xeloda pills she has from the last cycle.  LASSESSMENT & PLAN: Patient has 28 additional xeloda pills from this last 8th cycle of her xeloda treatment. She stated that she has only been taking 1 tablet ('500mg'$ ) twice daily during this last cycle. She is not sure why she thought she had been taking only one twice daily during her whole treatment or why she decreased although she has not had a single left over pill during the previous cycles of the medication. Patient has been taking the medication correctly throughout the previous 7 cycles. Discussed with PA and patient will not take remaining left over xeloda prescriptions at this time and we will discuss with MD when she returns.   Jenna Hutchinson, PharmD Hematology/Oncology Clinical Pharmacist [facility] [phone]

## 2022-03-30 ENCOUNTER — Inpatient Hospital Stay: Payer: PPO

## 2022-03-30 ENCOUNTER — Encounter: Payer: Self-pay | Admitting: Hematology and Oncology

## 2022-03-30 VITALS — BP 136/57 | HR 55 | Temp 97.7°F | Resp 20 | Ht 61.7 in | Wt 135.0 lb

## 2022-03-30 DIAGNOSIS — D519 Vitamin B12 deficiency anemia, unspecified: Secondary | ICD-10-CM | POA: Diagnosis not present

## 2022-03-30 DIAGNOSIS — D518 Other vitamin B12 deficiency anemias: Secondary | ICD-10-CM

## 2022-03-30 MED ORDER — CYANOCOBALAMIN 1000 MCG/ML IJ SOLN
1000.0000 ug | Freq: Once | INTRAMUSCULAR | Status: AC
  Administered 2022-03-30: 1000 ug via INTRAMUSCULAR
  Filled 2022-03-30: qty 1

## 2022-03-30 NOTE — Patient Instructions (Signed)

## 2022-03-30 NOTE — Addendum Note (Signed)
Addended by: Juanetta Beets on: 03/30/2022 02:03 PM   Modules accepted: Orders

## 2022-04-04 DIAGNOSIS — I2510 Atherosclerotic heart disease of native coronary artery without angina pectoris: Secondary | ICD-10-CM | POA: Diagnosis not present

## 2022-04-04 DIAGNOSIS — E785 Hyperlipidemia, unspecified: Secondary | ICD-10-CM | POA: Diagnosis not present

## 2022-04-04 DIAGNOSIS — M810 Age-related osteoporosis without current pathological fracture: Secondary | ICD-10-CM | POA: Diagnosis not present

## 2022-04-04 DIAGNOSIS — I10 Essential (primary) hypertension: Secondary | ICD-10-CM | POA: Diagnosis not present

## 2022-04-05 ENCOUNTER — Encounter: Payer: Self-pay | Admitting: Oncology

## 2022-04-05 ENCOUNTER — Other Ambulatory Visit: Payer: Self-pay | Admitting: Oncology

## 2022-04-05 ENCOUNTER — Inpatient Hospital Stay: Payer: PPO | Attending: Oncology | Admitting: Oncology

## 2022-04-05 ENCOUNTER — Inpatient Hospital Stay: Payer: PPO

## 2022-04-05 VITALS — BP 138/57 | HR 59 | Temp 97.5°F | Resp 16 | Ht 61.7 in | Wt 134.0 lb

## 2022-04-05 DIAGNOSIS — C772 Secondary and unspecified malignant neoplasm of intra-abdominal lymph nodes: Secondary | ICD-10-CM | POA: Diagnosis not present

## 2022-04-05 DIAGNOSIS — C186 Malignant neoplasm of descending colon: Secondary | ICD-10-CM

## 2022-04-05 DIAGNOSIS — D510 Vitamin B12 deficiency anemia due to intrinsic factor deficiency: Secondary | ICD-10-CM

## 2022-04-05 DIAGNOSIS — D500 Iron deficiency anemia secondary to blood loss (chronic): Secondary | ICD-10-CM | POA: Diagnosis not present

## 2022-04-05 DIAGNOSIS — D519 Vitamin B12 deficiency anemia, unspecified: Secondary | ICD-10-CM | POA: Diagnosis not present

## 2022-04-05 LAB — CMP (CANCER CENTER ONLY)
ALT: 19 U/L (ref 0–44)
AST: 28 U/L (ref 15–41)
Albumin: 4.1 g/dL (ref 3.5–5.0)
Alkaline Phosphatase: 52 U/L (ref 38–126)
Anion gap: 8 (ref 5–15)
BUN: 24 mg/dL — ABNORMAL HIGH (ref 8–23)
CO2: 24 mmol/L (ref 22–32)
Calcium: 9.1 mg/dL (ref 8.9–10.3)
Chloride: 107 mmol/L (ref 98–111)
Creatinine: 1.12 mg/dL — ABNORMAL HIGH (ref 0.44–1.00)
GFR, Estimated: 49 mL/min — ABNORMAL LOW (ref >60)
Glucose, Bld: 101 mg/dL — ABNORMAL HIGH (ref 70–99)
Potassium: 3.7 mmol/L (ref 3.5–5.1)
Sodium: 139 mmol/L (ref 135–145)
Total Bilirubin: 0.6 mg/dL (ref 0.3–1.2)
Total Protein: 6.7 g/dL (ref 6.5–8.1)

## 2022-04-05 LAB — CBC WITH DIFFERENTIAL (CANCER CENTER ONLY)
Abs Immature Granulocytes: 0.05 10*3/uL (ref 0.00–0.07)
Basophils Absolute: 0.1 10*3/uL (ref 0.0–0.1)
Basophils Relative: 1 %
Eosinophils Absolute: 0.1 10*3/uL (ref 0.0–0.5)
Eosinophils Relative: 1 %
HCT: 34.6 % — ABNORMAL LOW (ref 36.0–46.0)
Hemoglobin: 11.3 g/dL — ABNORMAL LOW (ref 12.0–15.0)
Immature Granulocytes: 1 %
Lymphocytes Relative: 28 %
Lymphs Abs: 1.6 10*3/uL (ref 0.7–4.0)
MCH: 34.2 pg — ABNORMAL HIGH (ref 26.0–34.0)
MCHC: 32.7 g/dL (ref 30.0–36.0)
MCV: 104.8 fL — ABNORMAL HIGH (ref 80.0–100.0)
Monocytes Absolute: 0.6 10*3/uL (ref 0.1–1.0)
Monocytes Relative: 11 %
Neutro Abs: 3.4 10*3/uL (ref 1.7–7.7)
Neutrophils Relative %: 58 %
Platelet Count: 243 10*3/uL (ref 150–400)
RBC: 3.3 MIL/uL — ABNORMAL LOW (ref 3.87–5.11)
RDW: 15.4 % (ref 11.5–15.5)
WBC Count: 5.9 10*3/uL (ref 4.0–10.5)
nRBC: 0 % (ref 0.0–0.2)

## 2022-04-05 NOTE — Progress Notes (Signed)
[facility]  [address] 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  She underwent surgical resection.  She is receiving adjuvant oral chemotherapy with capecitibine 1000 mg twice daily for 14 days every 21 days.  She continues to tolerate this fairly well.  She is having desquamation and intermittent pain of both feet.  She is using moisturizers regularly.  She has now completed her 6 months of capecitibine.   Iron deficiency anemia due to chronic blood loss She has not been taking her oral iron supplement regularly.    Macrocytic anemia Her anemia is now macrocytic, so unlikely due to iron deficiency. Her hemoglobin was slowly dropping, down to 10.6, but now up to 11.3. Folate was normal but B12 was quite low at 166 so she has been placed on B12 injections weekly. She does tell me her mother had to have B12 injections.     Plan I will see her back every 3 months with CBC, CMP and CEA. She will continue monthly B12 injections. Dr. Lilia Pro plans to do a repeat colonoscopy when she is 1 year post-op.  I did encourage her to go ahead and take the azithromycin for her sinusitis which was prescribed by her primary care.  The patient understands the plans discussed today and is in agreement with them.  She knows to contact our office if she develops concerns prior to her next appointment.  I provided 15 minutes of face-to-face time during this encounter and > 50% was spent counseling as documented under my assessment and plan.     ADDENDUM: The patient was called with her lab results.  Her creatinine has improved from 1.36 to 1.12 and her hemoglobin has increased from 10.6 to 11.3.  The rest is normal, including a non fasting BS of  101.  Derwood Kaplan, MD  [facility] [address] [place] [address] Dept: [phone] Dept Fax: [phone]   No orders of the defined types were placed in this encounter.     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 ([place])  08/11/2021 Cancer Staging   Staging form: Colon and Rectum, AJCC 8th Edition - Clinical stage from 08/11/2021: Stage IIIC (cT3, cN2b, cM0) - Signed by Derwood Kaplan, MD on 09/14/2021 Histopathologic type: Adenocarcinoma, NOS Stage prefix: Initial diagnosis Total positive nodes: 12 Total nodes examined: 32 Histologic grade (G): G3 Histologic grading system: 4 grade system Laterality: Left Tumor size (mm): 58 Lymph-vascular invasion (LVI): LVI present/identified, NOS Diagnostic confirmation: Positive histology Specimen type: Excision Staged by: Managing physician Tumor deposits (TD): Absent Carcinoembryonic antigen (CEA) (ng/mL): 3.6 Perineural invasion (PNI): Unknown Microsatellite instability (MSI): Stable KRAS mutation: Not assessed NRAS mutation: Not assessed BRAF mutation: Not assessed Stage used in treatment planning: Yes National guidelines used in treatment planning: Yes Type of national guideline used in treatment planning: NCCN Staging comments: Over 5 years old, will use capecitabine at reduced dose as tolerated   08/25/2021 Initial Diagnosis   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 She states that she is feeling ok  and her bowels are returning back to normal. She has been prescribed a Z-pack due to her sinus infection, and I encouraged her to go ahead and take that. Patient states that her hands and feet are still dry and peeling but she continues to keep them moisturized. She still has peeling of the bottom of her feet. I advised her that it will eventually stop after the new skin heals. She will continue her monthly B12 injections and will have a colonoscopy in 6 months. Her labs are pending. I will see her back every 3 months with CBC, CMP and CEA. She denies signs of infection such as sore throat, cough, or urinary symptoms.  She denies fevers or recurrent chills. She denies pain. She denies nausea, vomiting, chest pain, dyspnea or cough. Her weight has increased 2 pounds over last month .  REVIEW OF SYSTEMS:  Review of Systems  Constitutional: Negative.  Negative for appetite change, chills, diaphoresis, fatigue, fever and unexpected weight change.  HENT:  Negative.  Negative for hearing loss, lump/mass, mouth sores, nosebleeds, sore throat, trouble swallowing and voice change.   Eyes: Negative.  Negative for eye problems and icterus.  Respiratory: Negative.  Negative for chest tightness, cough, hemoptysis, shortness of breath and wheezing.   Cardiovascular: Negative.  Negative for chest pain, leg swelling and palpitations.  Gastrointestinal: Negative.  Negative for abdominal distention, abdominal pain, blood in stool, constipation, diarrhea, nausea, rectal pain and vomiting.  Endocrine: Negative.  Negative for hot flashes.  Genitourinary: Negative.  Negative for bladder incontinence, difficulty urinating, dyspareunia, dysuria, frequency, hematuria, menstrual problem, nocturia, pelvic pain, vaginal bleeding and vaginal discharge.   Musculoskeletal: Negative.  Negative for arthralgias, back pain, flank pain, gait problem, myalgias, neck pain and neck stiffness.  Skin: Negative.  Negative for itching, rash and  wound.  Neurological: Negative.  Negative for dizziness, extremity weakness, gait problem, headaches, light-headedness, numbness, seizures and speech difficulty.  Hematological: Negative.  Negative for adenopathy. Does not bruise/bleed easily.  Psychiatric/Behavioral: Negative.  Negative for confusion, decreased concentration, depression, sleep disturbance and suicidal ideas. The patient is not nervous/anxious.      VITALS:  Blood pressure (!) 138/57, pulse (!) 59, temperature (!) 97.5 F (36.4 C), temperature source Oral, resp. rate 16, height 5' 1.7" (1.567 m), weight 134 lb (60.8 kg), SpO2 100 %.  Wt Readings from Last 3 Encounters:  04/05/22 134 lb (60.8 kg)  03/30/22 135 lb (61.2 kg)  03/08/22 132 lb 3.2 oz (60 kg)    Body mass index is 24.75 kg/m.  Performance status (ECOG): 1 - Symptomatic but completely ambulatory  PHYSICAL EXAM:  Physical Exam Vitals and nursing note reviewed.  Constitutional:      General: She is not in acute distress.    Appearance: Normal appearance. She is not ill-appearing or diaphoretic.  HENT:     Head: Normocephalic and atraumatic.     Right Ear: Tympanic membrane, ear canal and external ear normal. There is no impacted cerumen.     Left Ear: Tympanic membrane, ear canal and external ear normal. There is no impacted cerumen.     Nose: Nose normal.     Mouth/Throat:     Mouth: Mucous membranes are moist.  Pharynx: Oropharynx is clear. No oropharyngeal exudate or posterior oropharyngeal erythema.  Eyes:     General: No scleral icterus.       Right eye: No discharge.        Left eye: No discharge.     Extraocular Movements: Extraocular movements intact.     Conjunctiva/sclera: Conjunctivae normal.     Pupils: Pupils are equal, round, and reactive to light.  Cardiovascular:     Rate and Rhythm: Normal rate and regular rhythm.     Pulses: Normal pulses.     Heart sounds: Normal heart sounds. No murmur heard.    No friction rub. No gallop.   Pulmonary:     Effort: Pulmonary effort is normal. No respiratory distress.     Breath sounds: Normal breath sounds. No stridor. No wheezing, rhonchi or rales.  Chest:     Chest wall: No tenderness.  Abdominal:     General: Bowel sounds are normal. There is no distension.     Palpations: Abdomen is soft. There is no hepatomegaly, splenomegaly or mass.     Tenderness: There is no abdominal tenderness. There is no right CVA tenderness, left CVA tenderness, guarding or rebound.     Hernia: No hernia is present.  Musculoskeletal:        General: No swelling, tenderness, deformity or signs of injury. Normal range of motion.     Cervical back: Normal range of motion and neck supple. No rigidity or tenderness.     Right lower leg: No edema.     Left lower leg: No edema.  Lymphadenopathy:     Cervical: No cervical adenopathy.     Upper Body:     Right upper body: No supraclavicular or axillary adenopathy.     Left upper body: No supraclavicular or axillary adenopathy.     Lower Body: No right inguinal adenopathy. No left inguinal adenopathy.  Skin:    General: Skin is warm and dry.     Coloration: Skin is not jaundiced or pale.     Findings: No bruising, erythema, lesion or rash.     Comments: Moderate dry skin of the bilateral plantar surfaces of feet and hands, with mild erythema.  There is no pain on palpation.  Neurological:     General: No focal deficit present.     Mental Status: She is alert and oriented to person, place, and time. Mental status is at baseline.     Cranial Nerves: No cranial nerve deficit.     Sensory: No sensory deficit.     Motor: No weakness.     Coordination: Coordination normal.     Gait: Gait normal.     Deep Tendon Reflexes: Reflexes normal.  Psychiatric:        Mood and Affect: Mood normal.        Behavior: Behavior normal.        Thought Content: Thought content normal.        Judgment: Judgment normal.     LABS:      Latest Ref Rng & Units  04/05/2022    1:58 PM 03/08/2022    1:22 PM 02/15/2022   12:00 AM  CBC  WBC 4.0 - 10.5 K/uL 5.9  6.7  5.6      Hemoglobin 12.0 - 15.0 g/dL 11.3  10.6  10.6      Hematocrit 36.0 - 46.0 % 34.6  33.0  31      Platelets 150 - 400 K/uL 243  248  212         This result is from an external source.      Latest Ref Rng & Units 04/05/2022    1:58 PM 03/08/2022    1:22 PM 02/15/2022    1:08 PM  CMP  Glucose 70 - 99 mg/dL 101  100  98   BUN 8 - 23 mg/dL _0 Creatinine 0.44 - 1.00 mg/dL 1.12  1.36  1.23   Sodium 135 - 145 mmol/L 139  142  140   Potassium 3.5 - 5.1 mmol/L 3.7  3.9  4.6   Chloride 98 - 111 mmol/L 107  109  112   CO2 22 - 32 mmol/L _1 Calcium 8.9 - 10.3 mg/dL 9.1  9.1  8.8   Total Protein 6.5 - 8.1 g/dL 6.7  6.3  6.2   Total Bilirubin 0.3 - 1.2 mg/dL 0.6  0.7  0.8   Alkaline Phos 38 - 126 U/L 52  62  62   AST 15 - 41 U/L _2 ALT 0 - 44 U/L _3 Component Ref Range & Units 2 mo ago  Folate >5.9 ng/mL 10.2    Lab Results  Component Value Date   CEA1 1.8 04/05/2022   /  CEA  Date Value Ref Range Status  04/05/2022 1.8 0.0 - 4.7 ng/mL Final    Comment:    (NOTE)                             Nonsmokers          <3.9                             Smokers             <5.6 Roche [employer] Electrochemiluminescence Immunoassay (ECLIA) Values obtained with different assay methods or kits cannot be used interchangeably.  Results cannot be interpreted as absolute evidence of the presence or absence of malignant disease. Performed At: [facility] [place], [place] [id number] Rush Farmer MD JO:[id number]    No results found for: "PSA1" No results found for: "[id number]" No results found for: "CAN125"  No results found for: "TOTALPROTELP", "ALBUMINELP", "A1GS", "A2GS", "BETS", "BETA2SER", "GAMS", "MSPIKE", "SPEI" No results found for: "TIBC", "FERRITIN", "IRONPCTSAT" No results found for: "LDH"  STUDIES:  No  results found.  CLINICAL DATA: Cancer of descending colon ([place])  EXAM: 07/14/21 CT VIRTUAL COLONOSCOPY DIAGNOSTIC  TECHNIQUE: The patient was given a standard bowel preparation with Gastrografin and barium for fluid and stool tagging respectively. The quality of the bowel preparation is is fair. Automated CO2 insufflation of the colon was performed prior to image acquisition and colonic distention is fair. Image post processing was used to generate a 3D endoluminal fly-through projection of the colon and to electronically subtract stool/fluid as appropriate. COMPARISON: 12/27/2012  FINDINGS: VIRTUAL COLONOSCOPY There is an area of apple-core like irregular narrowing of the lumen at the splenic flexure extending over approximately a 4 cm segment, seen best on coronal image 29, series 11 and from axial image 77, series 09/02 axial image ninety-one of series 9 concerning for malignancy. Extensive diverticulosis throughout the colon. Area of under distension noted in the mid sigmoid colon which could be related to diverticular disease or  annular constricting lesion/mass. No other fixed annual lesions or polypoid filling defects.  Virtual colonoscopy is not designed to detect diminutive polyps (i.e., less than or equal to 5 mm), the presence or absence of which may not affect clinical management.  CT ABDOMEN AND PELVIS WITHOUT CONTRAST  Lower chest: No acute findings. Hepatobiliary: No focal hepatic abnormality. Gallbladder unremarkable. Pancreas: No focal abnormality or ductal dilatation. Spleen: No focal abnormality. Normal size. Adrenals/Urinary Tract: No adrenal abnormality. No focal renal abnormality. No stones or hydronephrosis. Urinary bladder is unremarkable. Stomach/Bowel: Stomach and small bowel decompressed, grossly unremarkable. Vascular/Lymphatic: Aortoiliac atherosclerosis. No evidence of aneurysm or adenopathy. Reproductive: Prior hysterectomy. No adnexal  masses. Other: No free fluid or free air.  Musculoskeletal: Sclerotic area within the left iliac bone was present on prior imaging from 2014, likely reflecting bone island. Degenerative changes in the lumbar spine.  IMPRESSION: Suspicious masslike irregular apple-core lesion at the splenic flexure over a 4 cm segment concerning for colon cancer.  Area of under distension in the mid sigmoid colon which could be related to diverticular disease although annular lesion cannot be excluded. Recommend correlation with colonoscopy.  Diffuse colonic diverticulosis.  Aortic atherosclerosis.   HISTORY:   Past Medical History:  Diagnosis Date   Arthritis    B12 deficiency anemia 01/26/2022   HTN (hypertension)    Hypothyroidism    Osteoporosis     Past Surgical History:  Procedure Laterality Date   BLADDER SURGERY     COLON SURGERY     eyelid surgery      Family History  Problem Relation Age of Onset   Colon cancer Mother    Prostate cancer Brother     Social History:  reports that she has never smoked. She has never used smokeless tobacco. She reports that she does not drink alcohol and does not use drugs.The patient is alone today.  Allergies: No Known Allergies  Current Medications: Current Outpatient Medications  Medication Sig Dispense Refill   aspirin 81 MG chewable tablet Chew by mouth.     carvedilol (COREG) 3.125 MG tablet Take 3.125 mg by mouth 2 (two) times daily.     Cholecalciferol 25 MCG (1000 UT) capsule Take by mouth.     fluticasone (FLONASE) 50 MCG/ACT nasal spray Place 2 sprays into both nostrils daily.     furosemide (LASIX) 20 MG tablet Take 20 mg by mouth daily.     hydroxychloroquine (PLAQUENIL) 200 MG tablet Take 200 mg by mouth 2 (two) times daily.     losartan (COZAAR) 25 MG tablet Take 25 mg by mouth daily.     nitroGLYCERIN (NITROSTAT) 0.4 MG SL tablet Place under the tongue.     predniSONE (DELTASONE) 10 MG tablet Take 10 mg by mouth daily as  needed.     PROLIA 60 MG/ML SOSY injection Inject into the skin.     SYNTHROID 125 MCG tablet Take 125 mcg by mouth daily.     No current facility-administered medications for this visit.       I,Jasmine M Sluka,acting as a scribe for Derwood Kaplan, MD.,have documented all relevant documentation on the behalf of Derwood Kaplan, MD,as directed by  Derwood Kaplan, MD while in the presence of Derwood Kaplan, MD.

## 2022-04-06 LAB — CEA: CEA: 1.8 ng/mL (ref 0.0–4.7)

## 2022-04-07 ENCOUNTER — Telehealth: Payer: Self-pay

## 2022-04-07 NOTE — Telephone Encounter (Signed)
Called patient and notified her of lab results.  

## 2022-04-07 NOTE — Telephone Encounter (Signed)
-----   Message from Derwood Kaplan, MD sent at 04/05/2022  6:54 PM EST ----- Regarding: call Call her on cell phone, can leave message.  Tell her labs look good, BS 101, kidney function much better, nearly normal, hgb has come up from 10.6 to 11.3 (normal is 12)

## 2022-04-26 ENCOUNTER — Encounter: Payer: Self-pay | Admitting: Hematology and Oncology

## 2022-05-01 ENCOUNTER — Inpatient Hospital Stay: Payer: PPO

## 2022-05-01 VITALS — BP 144/62 | HR 113 | Temp 97.6°F | Resp 16 | Wt 135.0 lb

## 2022-05-01 DIAGNOSIS — D518 Other vitamin B12 deficiency anemias: Secondary | ICD-10-CM

## 2022-05-01 DIAGNOSIS — D519 Vitamin B12 deficiency anemia, unspecified: Secondary | ICD-10-CM | POA: Diagnosis not present

## 2022-05-01 MED ORDER — CYANOCOBALAMIN 1000 MCG/ML IJ SOLN
1000.0000 ug | Freq: Once | INTRAMUSCULAR | Status: AC
  Administered 2022-05-01: 1000 ug via INTRAMUSCULAR
  Filled 2022-05-01: qty 1

## 2022-05-01 NOTE — Patient Instructions (Signed)

## 2022-06-01 ENCOUNTER — Inpatient Hospital Stay: Payer: PPO | Attending: Oncology

## 2022-06-01 VITALS — BP 166/72 | HR 59 | Temp 98.1°F | Resp 18 | Ht 61.7 in | Wt 136.0 lb

## 2022-06-01 DIAGNOSIS — D518 Other vitamin B12 deficiency anemias: Secondary | ICD-10-CM

## 2022-06-01 DIAGNOSIS — D519 Vitamin B12 deficiency anemia, unspecified: Secondary | ICD-10-CM | POA: Insufficient documentation

## 2022-06-01 MED ORDER — CYANOCOBALAMIN 1000 MCG/ML IJ SOLN
1000.0000 ug | Freq: Once | INTRAMUSCULAR | Status: AC
  Administered 2022-06-01: 1000 ug via INTRAMUSCULAR
  Filled 2022-06-01: qty 1

## 2022-06-01 NOTE — Patient Instructions (Signed)
Vitamin B12 Deficiency Vitamin B12 deficiency means that your body does not have enough vitamin B12. The body needs this important vitamin: To make red blood cells. To make genes (DNA). To help the nerves work. If you do not have enough vitamin B12 in your body, you can have health problems, such as not having enough red blood cells in the blood (anemia). What are the causes? Not eating enough foods that contain vitamin B12. Not being able to take in (absorb) vitamin B12 from the food that you eat. Certain diseases. A condition in which the body does not make enough of a certain protein. This results in your body not taking in enough vitamin B12. Having a surgery in which part of the stomach or small intestine is taken out. Taking medicines that make it hard for the body to take in vitamin B12. These include: Heartburn medicines. Some medicines that are used to treat diabetes. What increases the risk? Being an older adult. Eating a vegetarian or vegan diet that does not include any foods that come from animals. Not eating enough foods that contain vitamin B12 while you are pregnant. Taking certain medicines. Having alcoholism. What are the signs or symptoms? In some cases, there are no symptoms. If the condition leads to too few blood cells or nerve damage, symptoms can occur, such as: Feeling weak or tired. Not being hungry. Losing feeling (numbness) or tingling in your hands and feet. Redness and burning of the tongue. Feeling sad (depressed). Confusion or memory problems. Trouble walking. If anemia is very bad, symptoms can include: Being short of breath. Being dizzy. Having a very fast heartbeat. How is this treated? Changing the way you eat and drink, such as: Eating more foods that contain vitamin B12. Drinking little or no alcohol. Getting vitamin B12 shots. Taking vitamin B12 supplements by mouth (orally). Your doctor will tell you the dose that is best for you. Follow  these instructions at home: Eating and drinking  Eat foods that come from animals and have a lot of vitamin B12 in them. These include: Meats and poultry. This includes beef, pork, chicken, turkey, and organ meats, such as liver. Seafood, such as clams, rainbow trout, salmon, tuna, and haddock. Eggs. Dairy foods such as milk, yogurt, and cheese. Eat breakfast cereals that have vitamin B12 added to them (are fortified). Check the label. The items listed above may not be a complete list of foods and beverages you can eat and drink. Contact a dietitian for more information. Alcohol use Do not drink alcohol if: Your doctor tells you not to drink. You are pregnant, may be pregnant, or are planning to become pregnant. If you drink alcohol: Limit how much you have to: 0-1 drink a day for women. 0-2 drinks a day for men. Know how much alcohol is in your drink. In the [place] one drink equals one 12 oz bottle of beer (355 mL), one 5 oz glass of wine (148 mL), or one 1 oz glass of hard liquor (44 mL). General instructions Get any vitamin B12 shots if told by your doctor. Take supplements only as told by your doctor. Follow the directions. Keep all follow-up visits. Contact a doctor if: Your symptoms come back. Your symptoms get worse or do not get better with treatment. Get help right away if: You have trouble breathing. You have a very fast heartbeat. You have chest pain. You get dizzy. You faint. These symptoms may be an emergency. Get help right away. Call 911.   Do not wait to see if the symptoms will go away. Do not drive yourself to the hospital. Summary Vitamin B12 deficiency means that your body is not getting enough of the vitamin. In some cases, there are no symptoms of this condition. Treatment may include making a change in the way you eat and drink, getting shots, or taking supplements. Eat foods that have vitamin B12 in them. This information is not intended to replace advice  given to you by your health care provider. Make sure you discuss any questions you have with your health care provider. Document Revised: 11/12/2020 Document Reviewed: 11/12/2020 Elsevier Patient Education  2023 [employer]  

## 2022-06-13 DIAGNOSIS — N390 Urinary tract infection, site not specified: Secondary | ICD-10-CM | POA: Diagnosis not present

## 2022-06-13 DIAGNOSIS — R399 Unspecified symptoms and signs involving the genitourinary system: Secondary | ICD-10-CM | POA: Diagnosis not present

## 2022-07-05 ENCOUNTER — Ambulatory Visit: Payer: PPO | Admitting: Oncology

## 2022-07-05 ENCOUNTER — Other Ambulatory Visit: Payer: PPO

## 2022-07-07 DIAGNOSIS — C186 Malignant neoplasm of descending colon: Secondary | ICD-10-CM | POA: Diagnosis not present

## 2022-07-11 DIAGNOSIS — I10 Essential (primary) hypertension: Secondary | ICD-10-CM | POA: Diagnosis not present

## 2022-07-11 DIAGNOSIS — M359 Systemic involvement of connective tissue, unspecified: Secondary | ICD-10-CM | POA: Diagnosis not present

## 2022-07-11 DIAGNOSIS — R600 Localized edema: Secondary | ICD-10-CM | POA: Diagnosis not present

## 2022-07-11 DIAGNOSIS — C772 Secondary and unspecified malignant neoplasm of intra-abdominal lymph nodes: Secondary | ICD-10-CM | POA: Diagnosis not present

## 2022-07-11 DIAGNOSIS — E559 Vitamin D deficiency, unspecified: Secondary | ICD-10-CM | POA: Diagnosis not present

## 2022-07-11 DIAGNOSIS — D539 Nutritional anemia, unspecified: Secondary | ICD-10-CM | POA: Diagnosis not present

## 2022-07-11 DIAGNOSIS — M810 Age-related osteoporosis without current pathological fracture: Secondary | ICD-10-CM | POA: Diagnosis not present

## 2022-07-11 DIAGNOSIS — I2510 Atherosclerotic heart disease of native coronary artery without angina pectoris: Secondary | ICD-10-CM | POA: Diagnosis not present

## 2022-07-11 DIAGNOSIS — E039 Hypothyroidism, unspecified: Secondary | ICD-10-CM | POA: Diagnosis not present

## 2022-07-11 DIAGNOSIS — Z79899 Other long term (current) drug therapy: Secondary | ICD-10-CM | POA: Diagnosis not present

## 2022-07-11 DIAGNOSIS — E785 Hyperlipidemia, unspecified: Secondary | ICD-10-CM | POA: Diagnosis not present

## 2022-07-14 NOTE — Progress Notes (Signed)
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  She underwent surgical resection. She has now completed her 6 months of capecitibine by the end of 2023.   Iron deficiency anemia due to chronic blood loss She has not been taking her oral iron supplement regularly.    Macrocytic anemia Her anemia is now macrocytic, so unlikely due to iron deficiency. Her hemoglobin was slowly dropping, down to 10.6, but now up to 11.3. Folate was normal but B12 was quite low at 166 so she has been placed on B12 injections weekly. She does tell me her mother had to have B12 injections.  She notes that she feels better on the B-12 injections.   Plan She missed her monthly B-12 injection last month so I will add that level to her labs today. Her labs today are pending and I will call her back with those results. She will continue monthly B12 injections. I will see her back in 3 months with CBC, CMP, and CEA. The patient understands the plans discussed today and is in agreement with them.  She knows to contact our office if she develops concerns prior to her next appointment.  I provided 15 minutes of face-to-face time during this encounter and > 50% was spent counseling as documented under my assessment and plan.    Dellia Beckwith, MD  [facility] AT [facility] [address] [place] [address] Dept: [phone] Dept Fax: [phone]   No orders of the defined types were placed in this encounter.   CHIEF COMPLAINT:  CC: Stage IIIC colon cancer  Current Treatment: Surveillance  HISTORY OF PRESENT ILLNESS:   Oncology History  Cancer of descending colon metastatic  to intra-abdominal lymph node (HCC)  08/11/2021 Cancer Staging   Staging form: Colon and Rectum, AJCC 8th Edition - Clinical stage from 08/11/2021: Stage IIIC (cT3, cN2b, cM0) - Signed by Dellia Beckwith, MD on 09/14/2021 Histopathologic type: Adenocarcinoma, NOS Stage prefix: Initial diagnosis Total positive nodes: 12 Total nodes examined: 32 Histologic grade (G): G3 Histologic grading system: 4 grade system Laterality: Left Tumor size (mm): 63 Lymph-vascular invasion (LVI): LVI present/identified, NOS Diagnostic confirmation: Positive histology Specimen type: Excision Staged by: Managing physician Tumor deposits (TD): Absent Carcinoembryonic antigen (CEA) (ng/mL): 3.6 Perineural invasion (PNI): Unknown Microsatellite instability (MSI): Stable KRAS mutation: Not assessed NRAS mutation: Not assessed BRAF mutation: Not assessed Stage used in treatment planning: Yes National guidelines used in treatment planning: Yes Type of national guideline used in treatment planning: NCCN Staging comments: Over 47 years old, will use capecitabine at reduced dose as tolerated   08/25/2021 Initial Diagnosis   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 Patient states that she feels well and has no complaints of pain. She informed me that she had a 24 hour virus and it has now resolved. Her PCP also checks her labs and she had that done recently,  all is well. She missed her monthly B-12 injection last month so I will add that level to her labs today. Her labs today are pending and I will call her back with those results. She will continue monthly B12 injections. I will see her back in 3 months with CBC, CMP, and CEA.  She denies signs of infection such as sore throat, sinus drainage, cough, or urinary symptoms.  She denies fevers or recurrent chills. She denies pain. She denies nausea, vomiting, chest pain, dyspnea or cough. Her appetite is good and her weight has decreased 6 pounds over last month .  REVIEW OF SYSTEMS:  Review of Systems  Constitutional: Negative.  Negative for appetite change, chills, diaphoresis, fatigue, fever and unexpected weight change.  HENT:  Negative.  Negative for hearing loss, lump/mass, mouth sores, nosebleeds, sore throat, tinnitus, trouble swallowing and voice change.   Eyes: Negative.  Negative for eye problems and icterus.  Respiratory: Negative.  Negative for chest tightness, cough, hemoptysis, shortness of breath and wheezing.   Cardiovascular: Negative.  Negative for chest pain, leg swelling and palpitations.  Gastrointestinal: Negative.  Negative for abdominal distention, abdominal pain, blood in stool, constipation, diarrhea, nausea, rectal pain and vomiting.  Endocrine: Negative.  Negative for hot flashes.  Genitourinary: Negative.  Negative for bladder incontinence, difficulty urinating, dyspareunia, dysuria, frequency, hematuria, menstrual problem, nocturia, pelvic pain, vaginal bleeding and vaginal discharge.   Musculoskeletal: Negative.  Negative for arthralgias, back pain, flank pain, gait problem, myalgias, neck pain and neck stiffness.  Skin: Negative.  Negative for itching, rash and wound.       Rash improved  Neurological: Negative.  Negative for dizziness, extremity weakness, gait problem, headaches, light-headedness, numbness, seizures and speech difficulty.  Hematological: Negative.  Negative for adenopathy. Does not bruise/bleed easily.  Psychiatric/Behavioral: Negative.  Negative for confusion, decreased concentration, depression, sleep disturbance and suicidal ideas. The patient is not nervous/anxious.      VITALS:  Blood pressure (!) 138/58, pulse (!)  54, temperature 98 F (36.7 C), temperature source Oral, resp. rate 17, height 5' 1.7" (1.567 m), weight 130 lb 3.2 oz (59.1 kg), SpO2 99 %.  Wt Readings from Last 3 Encounters:  07/31/22 130 lb (59 kg)  07/18/22 130 lb 3.2 oz (59.1 kg)  06/01/22 136 lb (61.7 kg)    Body mass index is 24.05 kg/m.  Performance status (ECOG): 1 - Symptomatic but completely ambulatory  PHYSICAL EXAM:  Physical Exam Vitals and nursing note reviewed.  Constitutional:      General: She is not in acute distress.    Appearance: Normal appearance. She is normal weight. She is not ill-appearing, toxic-appearing or diaphoretic.  HENT:     Head: Normocephalic and atraumatic.     Right Ear: Tympanic membrane, ear canal and external ear normal. There is no impacted cerumen.     Left Ear: Tympanic membrane, ear canal and external ear normal. There is no impacted cerumen.     Nose: Nose normal. No congestion or rhinorrhea.     Mouth/Throat:     Mouth: Mucous membranes are moist.     Pharynx: Oropharynx is clear. No oropharyngeal exudate or posterior oropharyngeal erythema.  Eyes:     General: No scleral icterus.       Right eye: No discharge.        Left eye: No discharge.     Extraocular Movements: Extraocular movements intact.     Conjunctiva/sclera: Conjunctivae normal.     Pupils: Pupils are equal, round,  and reactive to light.  Neck:     Vascular: No carotid bruit.  Cardiovascular:     Rate and Rhythm: Normal rate and regular rhythm.     Pulses: Normal pulses.     Heart sounds: Normal heart sounds. No murmur heard.    No friction rub. No gallop.  Pulmonary:     Effort: Pulmonary effort is normal. No respiratory distress.     Breath sounds: Normal breath sounds. No stridor. No wheezing, rhonchi or rales.  Chest:     Chest wall: No tenderness.  Abdominal:     General: Bowel sounds are normal. There is no distension.     Palpations: Abdomen is soft. There is no hepatomegaly, splenomegaly or mass.      Tenderness: There is no abdominal tenderness. There is no right CVA tenderness, left CVA tenderness, guarding or rebound.     Hernia: No hernia is present.  Musculoskeletal:        General: No swelling, tenderness, deformity or signs of injury. Normal range of motion.     Cervical back: Normal range of motion and neck supple. No rigidity or tenderness.     Right lower leg: No edema.     Left lower leg: No edema.  Lymphadenopathy:     Cervical: No cervical adenopathy.     Upper Body:     Right upper body: No supraclavicular or axillary adenopathy.     Left upper body: No supraclavicular or axillary adenopathy.     Lower Body: No right inguinal adenopathy. No left inguinal adenopathy.  Skin:    General: Skin is warm and dry.     Coloration: Skin is not jaundiced or pale.     Findings: No bruising, erythema, lesion or rash.  Neurological:     General: No focal deficit present.     Mental Status: She is alert and oriented to person, place, and time. Mental status is at baseline.     Cranial Nerves: No cranial nerve deficit.     Sensory: No sensory deficit.     Motor: No weakness.     Coordination: Coordination normal.     Gait: Gait normal.     Deep Tendon Reflexes: Reflexes normal.  Psychiatric:        Mood and Affect: Mood normal.        Behavior: Behavior normal.        Thought Content: Thought content normal.        Judgment: Judgment normal.     LABS:      Latest Ref Rng & Units 07/18/2022    1:17 PM 04/05/2022    1:58 PM 03/08/2022    1:22 PM  CBC  WBC 4.0 - 10.5 K/uL 5.0  5.9  6.7   Hemoglobin 12.0 - 15.0 g/dL 16.1  09.6  04.5   Hematocrit 36.0 - 46.0 % 32.1  34.6  33.0   Platelets 150 - 400 K/uL 249  243  248       Latest Ref Rng & Units 07/18/2022    1:17 PM 04/05/2022    1:58 PM 03/08/2022    1:22 PM  CMP  Glucose 70 - 99 mg/dL 88  409  811   BUN 8 - 23 mg/dL 21  24  20    Creatinine 0.44 - 1.00 mg/dL 9.14  7.82  9.56   Sodium 135 - 145 mmol/L 139  139  142    Potassium 3.5 - 5.1 mmol/L 3.8  3.7  3.9  Chloride 98 - 111 mmol/L 108  107  109   CO2 22 - 32 mmol/L 23  24  25    Calcium 8.9 - 10.3 mg/dL 8.5  9.1  9.1   Total Protein 6.5 - 8.1 g/dL 6.3  6.7  6.3   Total Bilirubin 0.3 - 1.2 mg/dL 0.7  0.6  0.7   Alkaline Phos 38 - 126 U/L 50  52  62   AST 15 - 41 U/L 32  28  28   ALT 0 - 44 U/L 18  19  19     Component Ref Range & Units 2 mo ago  Folate >5.9 ng/mL 10.2    Lab Results  Component Value Date   CEA1 1.5 07/18/2022   /  CEA  Date Value Ref Range Status  07/18/2022 1.5 0.0 - 4.7 ng/mL Final    Comment:    (NOTE)                             Nonsmokers          <3.9                             Smokers             <5.6 Roche [employer] Electrochemiluminescence Immunoassay (ECLIA) Values obtained with different assay methods or kits cannot be used interchangeably.  Results cannot be interpreted as absolute evidence of the presence or absence of malignant disease. Performed At: [facility] [address] [place], [place] [id number] Jolene Schimke MD JX:[id number]    No results found for: "PSA1" No results found for: "[id number]" No results found for: "CAN125"  No results found for: "TOTALPROTELP", "ALBUMINELP", "A1GS", "A2GS", "BETS", "BETA2SER", "GAMS", "MSPIKE", "SPEI" No results found for: "TIBC", "FERRITIN", "IRONPCTSAT" No results found for: "LDH"  STUDIES:  No results found.  CLINICAL DATA: Cancer of descending colon (HCC) EXAM: 07/14/21 CT VIRTUAL COLONOSCOPY DIAGNOSTIC IMPRESSION: Suspicious masslike irregular apple-core lesion at the splenic flexure over a 4 cm segment concerning for colon cancer. Area of under distension in the mid sigmoid colon which could be related to diverticular disease although annular lesion cannot be excluded. Recommend correlation with colonoscopy. Diffuse colonic diverticulosis. Aortic atherosclerosis.   HISTORY:   Past Medical History:  Diagnosis Date   Arthritis     B12 deficiency anemia 01/26/2022   HTN (hypertension)    Hypothyroidism    Osteoporosis     Past Surgical History:  Procedure Laterality Date   BLADDER SURGERY     COLON SURGERY     eyelid surgery      Family History  Problem Relation Age of Onset   Colon cancer Mother    Prostate cancer Brother     Social History:  reports that she has never smoked. She has never used smokeless tobacco. She reports that she does not drink alcohol and does not use drugs.The patient is alone today.  Allergies: No Known Allergies  Current Medications: Current Outpatient Medications  Medication Sig Dispense Refill   aspirin 81 MG chewable tablet Chew by mouth.     carvedilol (COREG) 3.125 MG tablet Take 3.125 mg by mouth 2 (two) times daily.     Cholecalciferol 25 MCG (1000 UT) capsule Take by mouth.     fluticasone (FLONASE) 50 MCG/ACT nasal spray Place 2 sprays into both nostrils daily.     furosemide (LASIX) 20 MG  tablet Take 20 mg by mouth daily.     hydroxychloroquine (PLAQUENIL) 200 MG tablet Take 200 mg by mouth 2 (two) times daily.     losartan (COZAAR) 25 MG tablet Take 25 mg by mouth daily.     nitroGLYCERIN (NITROSTAT) 0.4 MG SL tablet Place under the tongue.     predniSONE (DELTASONE) 10 MG tablet Take 10 mg by mouth daily as needed.     PROLIA 60 MG/ML SOSY injection Inject into the skin.     rosuvastatin (CRESTOR) 20 MG tablet Take 20 mg by mouth at bedtime.     SYNTHROID 125 MCG tablet Take 125 mcg by mouth daily.     No current facility-administered medications for this visit.     I,Jasmine M Hasegawa,acting as a scribe for Dellia Beckwith, MD.,have documented all relevant documentation on the behalf of Dellia Beckwith, MD,as directed by  Dellia Beckwith, MD while in the presence of Dellia Beckwith, MD.

## 2022-07-18 ENCOUNTER — Inpatient Hospital Stay: Payer: PPO | Attending: Oncology

## 2022-07-18 ENCOUNTER — Encounter: Payer: Self-pay | Admitting: Oncology

## 2022-07-18 ENCOUNTER — Other Ambulatory Visit: Payer: Self-pay

## 2022-07-18 ENCOUNTER — Inpatient Hospital Stay (INDEPENDENT_AMBULATORY_CARE_PROVIDER_SITE_OTHER): Payer: PPO | Admitting: Oncology

## 2022-07-18 ENCOUNTER — Other Ambulatory Visit: Payer: Self-pay | Admitting: Oncology

## 2022-07-18 VITALS — BP 138/58 | HR 54 | Temp 98.0°F | Resp 17 | Ht 61.7 in | Wt 130.2 lb

## 2022-07-18 DIAGNOSIS — D539 Nutritional anemia, unspecified: Secondary | ICD-10-CM | POA: Insufficient documentation

## 2022-07-18 DIAGNOSIS — D519 Vitamin B12 deficiency anemia, unspecified: Secondary | ICD-10-CM | POA: Insufficient documentation

## 2022-07-18 DIAGNOSIS — D500 Iron deficiency anemia secondary to blood loss (chronic): Secondary | ICD-10-CM | POA: Insufficient documentation

## 2022-07-18 DIAGNOSIS — C772 Secondary and unspecified malignant neoplasm of intra-abdominal lymph nodes: Secondary | ICD-10-CM | POA: Diagnosis not present

## 2022-07-18 DIAGNOSIS — D510 Vitamin B12 deficiency anemia due to intrinsic factor deficiency: Secondary | ICD-10-CM

## 2022-07-18 DIAGNOSIS — C186 Malignant neoplasm of descending colon: Secondary | ICD-10-CM | POA: Insufficient documentation

## 2022-07-18 LAB — CMP (CANCER CENTER ONLY)
ALT: 18 U/L (ref 0–44)
AST: 32 U/L (ref 15–41)
Albumin: 3.6 g/dL (ref 3.5–5.0)
Alkaline Phosphatase: 50 U/L (ref 38–126)
Anion gap: 8 (ref 5–15)
BUN: 21 mg/dL (ref 8–23)
CO2: 23 mmol/L (ref 22–32)
Calcium: 8.5 mg/dL — ABNORMAL LOW (ref 8.9–10.3)
Chloride: 108 mmol/L (ref 98–111)
Creatinine: 1.06 mg/dL — ABNORMAL HIGH (ref 0.44–1.00)
GFR, Estimated: 52 mL/min — ABNORMAL LOW (ref >60)
Glucose, Bld: 88 mg/dL (ref 70–99)
Potassium: 3.8 mmol/L (ref 3.5–5.1)
Sodium: 139 mmol/L (ref 135–145)
Total Bilirubin: 0.7 mg/dL (ref 0.3–1.2)
Total Protein: 6.3 g/dL — ABNORMAL LOW (ref 6.5–8.1)

## 2022-07-18 LAB — CBC WITH DIFFERENTIAL (CANCER CENTER ONLY)
Abs Immature Granulocytes: 0.04 10*3/uL (ref 0.00–0.07)
Basophils Absolute: 0.1 10*3/uL (ref 0.0–0.1)
Basophils Relative: 2 %
Eosinophils Absolute: 0.1 10*3/uL (ref 0.0–0.5)
Eosinophils Relative: 2 %
HCT: 32.1 % — ABNORMAL LOW (ref 36.0–46.0)
Hemoglobin: 10.3 g/dL — ABNORMAL LOW (ref 12.0–15.0)
Immature Granulocytes: 1 %
Lymphocytes Relative: 32 %
Lymphs Abs: 1.6 10*3/uL (ref 0.7–4.0)
MCH: 30.1 pg (ref 26.0–34.0)
MCHC: 32.1 g/dL (ref 30.0–36.0)
MCV: 93.9 fL (ref 80.0–100.0)
Monocytes Absolute: 0.6 10*3/uL (ref 0.1–1.0)
Monocytes Relative: 12 %
Neutro Abs: 2.6 10*3/uL (ref 1.7–7.7)
Neutrophils Relative %: 51 %
Platelet Count: 249 10*3/uL (ref 150–400)
RBC: 3.42 MIL/uL — ABNORMAL LOW (ref 3.87–5.11)
RDW: 14.9 % (ref 11.5–15.5)
WBC Count: 5 10*3/uL (ref 4.0–10.5)
nRBC: 0 % (ref 0.0–0.2)

## 2022-07-18 LAB — VITAMIN B12: Vitamin B-12: 506 pg/mL (ref 180–914)

## 2022-07-20 LAB — CEA: CEA: 1.5 ng/mL (ref 0.0–4.7)

## 2022-07-24 ENCOUNTER — Telehealth: Payer: Self-pay

## 2022-07-24 NOTE — Telephone Encounter (Signed)
-----   Message from Dellia Beckwith, MD sent at 07/20/2022 10:04 AM EDT ----- Regarding: call Tell her labs look good incl B12. Calcium a little low, rec she take supplement. Cancer test is normal

## 2022-07-24 NOTE — Telephone Encounter (Signed)
Patient notified of lab results. Patient states she has not been taking her calcium supplement everyday but will start doing that.

## 2022-07-28 ENCOUNTER — Encounter: Payer: Self-pay | Admitting: Hematology and Oncology

## 2022-07-31 ENCOUNTER — Inpatient Hospital Stay: Payer: PPO

## 2022-07-31 VITALS — BP 132/40 | HR 57 | Temp 97.8°F | Resp 18 | Ht 61.7 in | Wt 130.0 lb

## 2022-07-31 DIAGNOSIS — D519 Vitamin B12 deficiency anemia, unspecified: Secondary | ICD-10-CM | POA: Diagnosis not present

## 2022-07-31 DIAGNOSIS — D518 Other vitamin B12 deficiency anemias: Secondary | ICD-10-CM

## 2022-07-31 MED ORDER — CYANOCOBALAMIN 1000 MCG/ML IJ SOLN
1000.0000 ug | Freq: Once | INTRAMUSCULAR | Status: AC
  Administered 2022-07-31: 1000 ug via INTRAMUSCULAR
  Filled 2022-07-31: qty 1

## 2022-07-31 NOTE — Patient Instructions (Signed)

## 2022-08-02 ENCOUNTER — Encounter: Payer: Self-pay | Admitting: Hematology and Oncology

## 2022-08-11 DIAGNOSIS — M810 Age-related osteoporosis without current pathological fracture: Secondary | ICD-10-CM | POA: Diagnosis not present

## 2022-08-16 DIAGNOSIS — M810 Age-related osteoporosis without current pathological fracture: Secondary | ICD-10-CM | POA: Diagnosis not present

## 2022-08-18 DIAGNOSIS — L84 Corns and callosities: Secondary | ICD-10-CM | POA: Diagnosis not present

## 2022-08-18 DIAGNOSIS — M79674 Pain in right toe(s): Secondary | ICD-10-CM | POA: Diagnosis not present

## 2022-08-30 ENCOUNTER — Inpatient Hospital Stay: Payer: PPO | Attending: Oncology

## 2022-08-30 VITALS — BP 120/56 | HR 56 | Temp 97.5°F | Resp 18 | Wt 130.1 lb

## 2022-08-30 DIAGNOSIS — D518 Other vitamin B12 deficiency anemias: Secondary | ICD-10-CM

## 2022-08-30 DIAGNOSIS — D519 Vitamin B12 deficiency anemia, unspecified: Secondary | ICD-10-CM | POA: Insufficient documentation

## 2022-08-30 MED ORDER — CYANOCOBALAMIN 1000 MCG/ML IJ SOLN
1000.0000 ug | Freq: Once | INTRAMUSCULAR | Status: AC
  Administered 2022-08-30: 1000 ug via INTRAMUSCULAR
  Filled 2022-08-30: qty 1

## 2022-08-30 NOTE — Patient Instructions (Signed)

## 2022-09-01 DIAGNOSIS — E785 Hyperlipidemia, unspecified: Secondary | ICD-10-CM | POA: Diagnosis not present

## 2022-09-01 DIAGNOSIS — I10 Essential (primary) hypertension: Secondary | ICD-10-CM | POA: Diagnosis not present

## 2022-09-10 DIAGNOSIS — I2119 ST elevation (STEMI) myocardial infarction involving other coronary artery of inferior wall: Secondary | ICD-10-CM | POA: Diagnosis not present

## 2022-09-10 DIAGNOSIS — N3000 Acute cystitis without hematuria: Secondary | ICD-10-CM | POA: Diagnosis not present

## 2022-09-10 DIAGNOSIS — S0292XA Unspecified fracture of facial bones, initial encounter for closed fracture: Secondary | ICD-10-CM | POA: Diagnosis not present

## 2022-09-10 DIAGNOSIS — I10 Essential (primary) hypertension: Secondary | ICD-10-CM | POA: Diagnosis not present

## 2022-09-10 DIAGNOSIS — I4510 Unspecified right bundle-branch block: Secondary | ICD-10-CM | POA: Diagnosis not present

## 2022-09-10 DIAGNOSIS — Z79899 Other long term (current) drug therapy: Secondary | ICD-10-CM | POA: Diagnosis not present

## 2022-09-10 DIAGNOSIS — R9431 Abnormal electrocardiogram [ECG] [EKG]: Secondary | ICD-10-CM | POA: Diagnosis not present

## 2022-09-10 DIAGNOSIS — J984 Other disorders of lung: Secondary | ICD-10-CM | POA: Diagnosis not present

## 2022-09-10 DIAGNOSIS — I444 Left anterior fascicular block: Secondary | ICD-10-CM | POA: Diagnosis not present

## 2022-09-10 DIAGNOSIS — S0282XA Fracture of other specified skull and facial bones, left side, initial encounter for closed fracture: Secondary | ICD-10-CM | POA: Diagnosis not present

## 2022-09-10 DIAGNOSIS — I252 Old myocardial infarction: Secondary | ICD-10-CM | POA: Diagnosis not present

## 2022-09-10 DIAGNOSIS — S0083XA Contusion of other part of head, initial encounter: Secondary | ICD-10-CM | POA: Diagnosis not present

## 2022-09-10 DIAGNOSIS — S0990XA Unspecified injury of head, initial encounter: Secondary | ICD-10-CM | POA: Diagnosis not present

## 2022-09-10 DIAGNOSIS — S0003XA Contusion of scalp, initial encounter: Secondary | ICD-10-CM | POA: Diagnosis not present

## 2022-09-10 DIAGNOSIS — W19XXXA Unspecified fall, initial encounter: Secondary | ICD-10-CM | POA: Diagnosis not present

## 2022-09-10 DIAGNOSIS — R001 Bradycardia, unspecified: Secondary | ICD-10-CM | POA: Diagnosis not present

## 2022-09-10 DIAGNOSIS — E039 Hypothyroidism, unspecified: Secondary | ICD-10-CM | POA: Diagnosis not present

## 2022-09-14 ENCOUNTER — Telehealth: Payer: Self-pay

## 2022-09-14 DIAGNOSIS — S0285XD Fracture of orbit, unspecified, subsequent encounter for fracture with routine healing: Secondary | ICD-10-CM | POA: Diagnosis not present

## 2022-09-14 DIAGNOSIS — N182 Chronic kidney disease, stage 2 (mild): Secondary | ICD-10-CM | POA: Diagnosis not present

## 2022-09-14 DIAGNOSIS — D539 Nutritional anemia, unspecified: Secondary | ICD-10-CM | POA: Diagnosis not present

## 2022-09-14 DIAGNOSIS — N390 Urinary tract infection, site not specified: Secondary | ICD-10-CM | POA: Diagnosis not present

## 2022-09-14 DIAGNOSIS — W19XXXA Unspecified fall, initial encounter: Secondary | ICD-10-CM | POA: Diagnosis not present

## 2022-09-14 DIAGNOSIS — I10 Essential (primary) hypertension: Secondary | ICD-10-CM | POA: Diagnosis not present

## 2022-09-14 NOTE — Telephone Encounter (Signed)
Transition Care Management Unsuccessful Follow-up Telephone Call  Date of discharge and from where:  09/10/2022 [facility]  Attempts:  2nd Attempt  Reason for unsuccessful TCM follow-up call:  Unable to reach patient  Traye Bates [facility]  [facility] Population Health Community Resource Care Guide   [email or web address]  ?? [phone]   Website: [email or web address]  [email or web address]

## 2022-09-14 NOTE — Telephone Encounter (Signed)
Transition Care Management Unsuccessful Follow-up Telephone Call  Date of discharge and from where:  09/10/2022 [facility]  Attempts:  1st Attempt  Reason for unsuccessful TCM follow-up call:  No answer/busy  Simran Bomkamp [facility]  [facility] Population Health Community Resource Care Guide   [email or web address]  ?? [phone]   Website: [email or web address]  [email or web address]

## 2022-09-25 DIAGNOSIS — I10 Essential (primary) hypertension: Secondary | ICD-10-CM | POA: Diagnosis not present

## 2022-09-25 DIAGNOSIS — R2689 Other abnormalities of gait and mobility: Secondary | ICD-10-CM | POA: Diagnosis not present

## 2022-09-25 DIAGNOSIS — J309 Allergic rhinitis, unspecified: Secondary | ICD-10-CM | POA: Diagnosis not present

## 2022-09-25 DIAGNOSIS — N390 Urinary tract infection, site not specified: Secondary | ICD-10-CM | POA: Diagnosis not present

## 2022-09-29 ENCOUNTER — Inpatient Hospital Stay: Payer: PPO | Attending: Oncology

## 2022-09-29 VITALS — BP 145/58 | HR 53 | Temp 97.6°F | Resp 16

## 2022-09-29 DIAGNOSIS — D519 Vitamin B12 deficiency anemia, unspecified: Secondary | ICD-10-CM | POA: Diagnosis not present

## 2022-09-29 DIAGNOSIS — D518 Other vitamin B12 deficiency anemias: Secondary | ICD-10-CM

## 2022-09-29 MED ORDER — CYANOCOBALAMIN 1000 MCG/ML IJ SOLN
1000.0000 ug | Freq: Once | INTRAMUSCULAR | Status: AC
  Administered 2022-09-29: 1000 ug via INTRAMUSCULAR
  Filled 2022-09-29: qty 1

## 2022-09-29 NOTE — Patient Instructions (Signed)

## 2022-10-11 NOTE — Progress Notes (Signed)
[facility]  [address] 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  She underwent surgical resection. She completed her 6 months of capecitibine by the end of 2023.   Iron deficiency anemia due to chronic blood loss She has not been taking her oral iron supplement. I will repeat Iron studies today.   Macrocytic anemia Her anemia was macrocytic. Her hemoglobin was slowly dropping, down to 10.6, then went up to 11.3 but dropped to 10.3 in April,2024. Folate was normal but B12 was quite low at 166 so she was placed on B12 injections monthly. She does tell me her mother had to have B12 injections.  She notes that she feels better on the B-12 injections.  A repeat level in April was 506 despite missing an injection, so we will try stopping thew B12 injections and repeat the level so that we can tell whether she will need it lifelong.  Plan She has an appointment coming up with physical therapy to check her balance. I recommended that we stop the Vit B12 shots and then recheck the level at next visit in 3 months. Her Vit D3 pills have been increased to twice daily. Her labs are pending today and I will call her with the results. I will add iron, TIBC, and ferritin levels to today's blood work. Her blood pressure has been well controlled at home. I will schedule her for a repeat colonoscopy with Dr Jennye Boroughs. She had a virtual colonoscopy in April, 2023 but I advised she have a repeat procedure done in person since it is over 1 year since her surgery.  He is 82 years old but has a great performance status and continues to work.  I will see her  back in 3 months with CBC, CMP, CEA, and B12. The patient requests that I send her notes to her PCP.  The patient understands the plans discussed today and is in agreement with them.  She knows to contact our office if she develops concerns prior to her next appointment.  I provided 20 minutes of face-to-face time during this encounter and > 50% was spent counseling as documented under my assessment and plan.    Dellia Beckwith, MD  [facility] [address] [place] [address] Dept: [phone] Dept Fax: [phone]   Orders Placed This Encounter  Procedures   CBC with Differential (Cancer Center Only)    Standing Status:   Future    Standing Expiration Date:   10/17/2023   CMP (Cancer Center only)    Standing Status:   Future    Standing Expiration Date:   10/17/2023   CEA    Standing Status:   Future    Standing Expiration Date:   10/17/2023   Vitamin B12    Standing Status:   Future    Standing Expiration Date:   10/17/2023    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)  08/11/2021 Cancer Staging   Staging form: Colon and Rectum, AJCC 8th Edition - Clinical stage from 08/11/2021: Stage IIIC (cT3, cN2b, cM0) -  Signed by Dellia Beckwith, MD on 09/14/2021 Histopathologic type: Adenocarcinoma, NOS Stage prefix: Initial diagnosis Total positive nodes: 12 Total nodes examined: 32 Histologic grade (G): G3 Histologic grading system: 4 grade system Laterality: Left Tumor size (mm): 63 Lymph-vascular invasion (LVI): LVI present/identified, NOS Diagnostic confirmation: Positive histology Specimen type: Excision Staged by: Managing physician Tumor deposits (TD): Absent Carcinoembryonic antigen (CEA) (ng/mL): 3.6 Perineural invasion (PNI): Unknown Microsatellite instability (MSI): Stable KRAS mutation: Not assessed NRAS mutation: Not  assessed BRAF mutation: Not assessed Stage used in treatment planning: Yes National guidelines used in treatment planning: Yes Type of national guideline used in treatment planning: NCCN Staging comments: Over 19 years old, will use capecitabine at reduced dose as tolerated   08/25/2021 Initial Diagnosis   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 Patient states that she feels fine now but she had a [place] in [place] and had loss of consciousness. She also had a fracture on the left supraorbital area, she still had ecchymosis of the left face. She says she is doing very fine now, although she has some form of imbalance occasionally. She has an appointment coming up with physical therapy to check her balance. I recommended that we stop the Vit B12 shots and then recheck the level at the end of the year since her level was 506 in April despite missing a dose. Her Vit D3 pills have been increased to twice daily. Her labs are pending today and I will call her with the results. I will add iron, TIBC, and ferritin levels to today's blood work. Her blood pressure has been well controlled at home. I will schedule her for a repeat colonoscopy with Dr Jennye Boroughs. She had a virtual colonoscopy in April, 2023 but I advised she has a repeat procedure done in person since it has been over 1 year since her surgery.  She is 82 years old but has a great performance status and continues to work..  I will see her  back in 3 months with CBC, CMP, CEA, and B12. The patient requests that I send her notes to her PCP. She  denies signs of infections such as sore throat, sinus drainage, cough or urinary symptoms. She   denies fever or recurrent chills. She  also deny nausea, vomiting, chest pain dyspnea or cough. Her  appetite is fine and Her  weight has decreased 10 pounds over last 3 months     REVIEW OF SYSTEMS:  Review of Systems  Constitutional:  Positive for unexpected weight change. Negative for appetite change, chills, diaphoresis, fatigue and fever.  HENT:  Negative.  Negative for hearing loss, lump/mass, mouth sores, nosebleeds, sore throat, tinnitus, trouble swallowing and voice change.   Eyes: Negative.  Negative for eye problems and icterus.  Respiratory: Negative.  Negative for chest tightness, cough, hemoptysis, shortness of breath and wheezing.   Cardiovascular: Negative.  Negative for chest pain, leg swelling and palpitations.  Gastrointestinal: Negative.  Negative for abdominal distention, abdominal pain, blood in stool, constipation, diarrhea, nausea, rectal pain and vomiting.  Endocrine: Negative.  Negative for hot flashes.  Genitourinary: Negative.  Negative for bladder incontinence, difficulty urinating, dyspareunia, dysuria, frequency,  hematuria, menstrual problem, nocturia, pelvic pain, vaginal bleeding and vaginal discharge.   Musculoskeletal: Negative.  Negative for arthralgias, back pain, flank pain, gait problem, myalgias, neck pain and neck stiffness.  Skin: Negative.  Negative for itching, rash and wound.  Neurological: Negative.  Negative for dizziness, extremity weakness, gait problem, headaches, light-headedness, numbness, seizures and speech difficulty.  Hematological: Negative.  Negative for adenopathy. Does not bruise/bleed easily.  Psychiatric/Behavioral: Negative.  Negative for confusion, decreased concentration, depression, sleep disturbance and suicidal ideas. The patient is not nervous/anxious.      VITALS:  Blood pressure (!) 155/69, pulse (!) 53, temperature 97.9 F (36.6 C), temperature source Oral, resp. rate 18, height 5' 1.7" (1.567 m), weight 120 lb 6.4 oz (54.6  kg), SpO2 100%.  Wt Readings from Last 3 Encounters:  10/17/22 120 lb 6.4 oz (54.6 kg)  08/30/22 130 lb 1.3 oz (59 kg)  07/31/22 130 lb (59 kg)    Body mass index is 22.24 kg/m.  Performance status (ECOG): 1 - Symptomatic but completely ambulatory  PHYSICAL EXAM:  Physical Exam Vitals and nursing note reviewed.  Constitutional:      General: She is not in acute distress.    Appearance: Normal appearance. She is normal weight. She is not ill-appearing, toxic-appearing or diaphoretic.  HENT:     Head: Normocephalic and atraumatic.     Right Ear: Tympanic membrane, ear canal and external ear normal. There is no impacted cerumen.     Left Ear: Tympanic membrane, ear canal and external ear normal. There is no impacted cerumen.     Nose: Nose normal. No congestion or rhinorrhea.     Mouth/Throat:     Mouth: Mucous membranes are moist.     Pharynx: Oropharynx is clear. No oropharyngeal exudate or posterior oropharyngeal erythema.  Eyes:     General: No scleral icterus.       Right eye: No discharge.        Left eye: No discharge.     Extraocular Movements: Extraocular movements intact.     Conjunctiva/sclera: Conjunctivae normal.     Pupils: Pupils are equal, round, and reactive to light.  Neck:     Vascular: No carotid bruit.  Cardiovascular:     Rate and Rhythm: Normal rate and regular rhythm.     Pulses: Normal pulses.     Heart sounds: Normal heart sounds. No murmur heard.    No friction rub. No gallop.  Pulmonary:     Effort: Pulmonary effort is normal. No respiratory distress.     Breath sounds: Normal breath sounds. No stridor. No wheezing, rhonchi or rales.  Chest:     Chest wall: No tenderness.  Abdominal:     General: Bowel sounds are normal. There is no distension.     Palpations: Abdomen is soft. There is no hepatomegaly, splenomegaly or mass.     Tenderness: There is no abdominal tenderness. There is no right CVA tenderness, left CVA tenderness, guarding or  rebound.     Hernia: No hernia is present.  Musculoskeletal:        General: No swelling, tenderness, deformity or signs of injury. Normal range of motion.     Cervical back: Normal range of motion and neck supple. No rigidity or tenderness.     Right lower leg: No edema.     Left lower leg: No edema.  Lymphadenopathy:     Cervical: No cervical adenopathy.     Upper Body:     Right upper  body: No supraclavicular or axillary adenopathy.     Left upper body: No supraclavicular or axillary adenopathy.     Lower Body: No right inguinal adenopathy. No left inguinal adenopathy.  Skin:    General: Skin is warm and dry.     Coloration: Skin is not jaundiced or pale.     Findings: Ecchymosis (left face) present. No bruising, erythema, lesion or rash.  Neurological:     General: No focal deficit present.     Mental Status: She is alert and oriented to person, place, and time. Mental status is at baseline.     Cranial Nerves: No cranial nerve deficit.     Sensory: No sensory deficit.     Motor: No weakness.     Coordination: Coordination normal.     Gait: Gait normal.     Deep Tendon Reflexes: Reflexes normal.  Psychiatric:        Mood and Affect: Mood normal.        Behavior: Behavior normal.        Thought Content: Thought content normal.        Judgment: Judgment normal.     LABS:      Latest Ref Rng & Units 07/18/2022    1:17 PM 04/05/2022    1:58 PM 03/08/2022    1:22 PM  CBC  WBC 4.0 - 10.5 K/uL 5.0  5.9  6.7   Hemoglobin 12.0 - 15.0 g/dL 52.8  41.3  24.4   Hematocrit 36.0 - 46.0 % 32.1  34.6  33.0   Platelets 150 - 400 K/uL 249  243  248       Latest Ref Rng & Units 07/18/2022    1:17 PM 04/05/2022    1:58 PM 03/08/2022    1:22 PM  CMP  Glucose 70 - 99 mg/dL 88  010  272   BUN 8 - 23 mg/dL 21  24  20    Creatinine 0.44 - 1.00 mg/dL 5.36  6.44  0.34   Sodium 135 - 145 mmol/L 139  139  142   Potassium 3.5 - 5.1 mmol/L 3.8  3.7  3.9   Chloride 98 - 111 mmol/L 108  107  109    CO2 22 - 32 mmol/L 23  24  25    Calcium 8.9 - 10.3 mg/dL 8.5  9.1  9.1   Total Protein 6.5 - 8.1 g/dL 6.3  6.7  6.3   Total Bilirubin 0.3 - 1.2 mg/dL 0.7  0.6  0.7   Alkaline Phos 38 - 126 U/L 50  52  62   AST 15 - 41 U/L 32  28  28   ALT 0 - 44 U/L 18  19  19     Component Ref Range & Units 2 mo ago  Folate >5.9 ng/mL 10.2    Lab Results  Component Value Date   CEA1 1.5 07/18/2022   /  CEA  Date Value Ref Range Status  07/18/2022 1.5 0.0 - 4.7 ng/mL Final    Comment:    (NOTE)                             Nonsmokers          <3.9                             Smokers             <  5.6 Roche [employer] Electrochemiluminescence Immunoassay (ECLIA) Values obtained with different assay methods or kits cannot be used interchangeably.  Results cannot be interpreted as absolute evidence of the presence or absence of malignant disease. Performed At: [facility] [address] [place], [place] [id number] Jolene Schimke MD NW:[id number]    No results found for: "PSA1" No results found for: "[id number]" No results found for: "CAN125"  No results found for: "TOTALPROTELP", "ALBUMINELP", "A1GS", "A2GS", "BETS", "BETA2SER", "GAMS", "MSPIKE", "SPEI" No results found for: "TIBC", "FERRITIN", "IRONPCTSAT" No results found for: "LDH"  STUDIES:  No results found.  CLINICAL DATA: Cancer of descending colon (HCC) EXAM: 07/14/21 CT VIRTUAL COLONOSCOPY DIAGNOSTIC IMPRESSION: Suspicious masslike irregular apple-core lesion at the splenic flexure over a 4 cm segment concerning for colon cancer. Area of under distension in the mid sigmoid colon which could be related to diverticular disease although annular lesion cannot be excluded. Recommend correlation with colonoscopy. Diffuse colonic diverticulosis. Aortic atherosclerosis.   HISTORY:   Past Medical History:  Diagnosis Date   Arthritis    B12 deficiency anemia 01/26/2022   HTN (hypertension)    Hypothyroidism     Osteoporosis     Past Surgical History:  Procedure Laterality Date   BLADDER SURGERY     COLON SURGERY     eyelid surgery      Family History  Problem Relation Age of Onset   Colon cancer Mother    Prostate cancer Brother     Social History:  reports that she has never smoked. She has never used smokeless tobacco. She reports that she does not drink alcohol and does not use drugs.The patient is alone today.  Allergies: No Known Allergies  Current Medications: Current Outpatient Medications  Medication Sig Dispense Refill   aspirin 81 MG chewable tablet Chew by mouth.     carvedilol (COREG) 3.125 MG tablet Take 3.125 mg by mouth 2 (two) times daily.     Cholecalciferol 25 MCG (1000 UT) capsule Take by mouth.     fluticasone (FLONASE) 50 MCG/ACT nasal spray Place 2 sprays into both nostrils daily.     furosemide (LASIX) 20 MG tablet Take 20 mg by mouth daily.     hydroxychloroquine (PLAQUENIL) 200 MG tablet Take 200 mg by mouth 2 (two) times daily.     nitroGLYCERIN (NITROSTAT) 0.4 MG SL tablet Place under the tongue.     predniSONE (DELTASONE) 10 MG tablet Take 10 mg by mouth daily as needed.     PROLIA 60 MG/ML SOSY injection Inject into the skin.     rosuvastatin (CRESTOR) 20 MG tablet Take 20 mg by mouth at bedtime.     SYNTHROID 125 MCG tablet Take 125 mcg by mouth daily.     No current facility-administered medications for this visit.      I,Oluwatobi Asade,acting as a scribe for Dellia Beckwith, MD.,have documented all relevant documentation on the behalf of Dellia Beckwith, MD,as directed by  Dellia Beckwith, MD while in the presence of Dellia Beckwith, MD.

## 2022-10-17 ENCOUNTER — Other Ambulatory Visit: Payer: Self-pay

## 2022-10-17 ENCOUNTER — Inpatient Hospital Stay: Payer: PPO | Attending: Oncology | Admitting: Oncology

## 2022-10-17 ENCOUNTER — Other Ambulatory Visit: Payer: Self-pay | Admitting: Oncology

## 2022-10-17 ENCOUNTER — Inpatient Hospital Stay: Payer: PPO

## 2022-10-17 ENCOUNTER — Encounter: Payer: Self-pay | Admitting: Oncology

## 2022-10-17 VITALS — BP 155/69 | HR 53 | Temp 97.9°F | Resp 18 | Ht 61.7 in | Wt 120.4 lb

## 2022-10-17 DIAGNOSIS — C772 Secondary and unspecified malignant neoplasm of intra-abdominal lymph nodes: Secondary | ICD-10-CM | POA: Insufficient documentation

## 2022-10-17 DIAGNOSIS — E538 Deficiency of other specified B group vitamins: Secondary | ICD-10-CM | POA: Insufficient documentation

## 2022-10-17 DIAGNOSIS — D500 Iron deficiency anemia secondary to blood loss (chronic): Secondary | ICD-10-CM

## 2022-10-17 DIAGNOSIS — C186 Malignant neoplasm of descending colon: Secondary | ICD-10-CM

## 2022-10-17 DIAGNOSIS — D539 Nutritional anemia, unspecified: Secondary | ICD-10-CM | POA: Diagnosis not present

## 2022-10-17 DIAGNOSIS — D510 Vitamin B12 deficiency anemia due to intrinsic factor deficiency: Secondary | ICD-10-CM

## 2022-10-17 LAB — CBC WITH DIFFERENTIAL (CANCER CENTER ONLY)
Abs Immature Granulocytes: 0.02 10*3/uL (ref 0.00–0.07)
Basophils Absolute: 0.1 10*3/uL (ref 0.0–0.1)
Basophils Relative: 1 %
Eosinophils Absolute: 0 10*3/uL (ref 0.0–0.5)
Eosinophils Relative: 0 %
HCT: 36.2 % (ref 36.0–46.0)
Hemoglobin: 11.6 g/dL — ABNORMAL LOW (ref 12.0–15.0)
Immature Granulocytes: 0 %
Lymphocytes Relative: 31 %
Lymphs Abs: 1.9 10*3/uL (ref 0.7–4.0)
MCH: 29.9 pg (ref 26.0–34.0)
MCHC: 32 g/dL (ref 30.0–36.0)
MCV: 93.3 fL (ref 80.0–100.0)
Monocytes Absolute: 0.7 10*3/uL (ref 0.1–1.0)
Monocytes Relative: 11 %
Neutro Abs: 3.5 10*3/uL (ref 1.7–7.7)
Neutrophils Relative %: 57 %
Platelet Count: 194 10*3/uL (ref 150–400)
RBC: 3.88 MIL/uL (ref 3.87–5.11)
RDW: 13.1 % (ref 11.5–15.5)
WBC Count: 6.2 10*3/uL (ref 4.0–10.5)
nRBC: 0 % (ref 0.0–0.2)

## 2022-10-17 LAB — CMP (CANCER CENTER ONLY)
ALT: 24 U/L (ref 0–44)
AST: 28 U/L (ref 15–41)
Albumin: 4 g/dL (ref 3.5–5.0)
Alkaline Phosphatase: 56 U/L (ref 38–126)
Anion gap: 9 (ref 5–15)
BUN: 31 mg/dL — ABNORMAL HIGH (ref 8–23)
CO2: 22 mmol/L (ref 22–32)
Calcium: 9.3 mg/dL (ref 8.9–10.3)
Chloride: 109 mmol/L (ref 98–111)
Creatinine: 1.34 mg/dL — ABNORMAL HIGH (ref 0.44–1.00)
GFR, Estimated: 40 mL/min — ABNORMAL LOW (ref >60)
Glucose, Bld: 86 mg/dL (ref 70–99)
Potassium: 3.7 mmol/L (ref 3.5–5.1)
Sodium: 140 mmol/L (ref 135–145)
Total Bilirubin: 0.7 mg/dL (ref 0.3–1.2)
Total Protein: 6.8 g/dL (ref 6.5–8.1)

## 2022-10-17 LAB — IRON AND TIBC
Iron: 61 ug/dL (ref 28–170)
Saturation Ratios: 20 % (ref 10.4–31.8)
TIBC: 311 ug/dL (ref 250–450)
UIBC: 250 ug/dL

## 2022-10-17 LAB — FERRITIN: Ferritin: 53 ng/mL (ref 11–307)

## 2022-10-18 ENCOUNTER — Telehealth: Payer: Self-pay | Admitting: Oncology

## 2022-10-18 ENCOUNTER — Telehealth: Payer: Self-pay

## 2022-10-18 LAB — CEA: CEA: 1.5 ng/mL (ref 0.0–4.7)

## 2022-10-18 NOTE — Telephone Encounter (Signed)
-----   Message from Dellia Beckwith sent at 10/17/2022  5:26 PM EDT ----- Regarding: note Note is done but labs still pending.  We need to make sure copy of note goes to [facility] and to Dr. Lequita Halt, along with the labs when they are available, and also all to Dr. Jennye Boroughs

## 2022-10-18 NOTE — Telephone Encounter (Signed)
Contacted pt to schedule an appt. Unable to reach via phone, voicemail was left.   Scheduling Message Entered by Gery Pray H on 10/17/2022 at  2:19 PM Priority: Routine <No visit type provided>  Department: [facility]  Provider:  Scheduling Notes:  RT 3 months with labs

## 2022-10-18 NOTE — Telephone Encounter (Signed)
Office note faxed to Dr. Lequita Halt and Gaye Alken, NP

## 2022-10-19 ENCOUNTER — Telehealth: Payer: Self-pay | Admitting: Oncology

## 2022-10-19 NOTE — Telephone Encounter (Signed)
10/19/22 Spoke with patient and confirmed next appt.

## 2022-10-20 ENCOUNTER — Telehealth: Payer: Self-pay

## 2022-10-20 NOTE — Telephone Encounter (Signed)
Left detailed message to inform patient of lab results. Advised to increase fluids due to some dehydration, and no need in taking iron.

## 2022-10-20 NOTE — Telephone Encounter (Signed)
-----   Message from Dellia Beckwith sent at 10/19/2022  1:36 PM EDT ----- Regarding: call Tell her labs good but she is dehydrated again, needs to drink more fluids. Anemia better, red cells almost normal

## 2022-10-30 ENCOUNTER — Ambulatory Visit: Payer: PPO

## 2022-11-02 DIAGNOSIS — Z139 Encounter for screening, unspecified: Secondary | ICD-10-CM | POA: Diagnosis not present

## 2022-11-02 DIAGNOSIS — Z1331 Encounter for screening for depression: Secondary | ICD-10-CM | POA: Diagnosis not present

## 2022-11-02 DIAGNOSIS — I10 Essential (primary) hypertension: Secondary | ICD-10-CM | POA: Diagnosis not present

## 2022-11-02 DIAGNOSIS — R634 Abnormal weight loss: Secondary | ICD-10-CM | POA: Diagnosis not present

## 2022-11-02 DIAGNOSIS — Z9181 History of falling: Secondary | ICD-10-CM | POA: Diagnosis not present

## 2022-11-30 ENCOUNTER — Ambulatory Visit: Payer: PPO

## 2023-01-01 ENCOUNTER — Ambulatory Visit: Payer: PPO

## 2023-01-01 DIAGNOSIS — H524 Presbyopia: Secondary | ICD-10-CM | POA: Diagnosis not present

## 2023-01-01 DIAGNOSIS — Z79899 Other long term (current) drug therapy: Secondary | ICD-10-CM | POA: Diagnosis not present

## 2023-01-03 DIAGNOSIS — I252 Old myocardial infarction: Secondary | ICD-10-CM | POA: Diagnosis not present

## 2023-01-03 DIAGNOSIS — R55 Syncope and collapse: Secondary | ICD-10-CM | POA: Diagnosis not present

## 2023-01-03 DIAGNOSIS — Z09 Encounter for follow-up examination after completed treatment for conditions other than malignant neoplasm: Secondary | ICD-10-CM | POA: Diagnosis not present

## 2023-01-03 DIAGNOSIS — I452 Bifascicular block: Secondary | ICD-10-CM | POA: Diagnosis not present

## 2023-01-03 DIAGNOSIS — I4510 Unspecified right bundle-branch block: Secondary | ICD-10-CM | POA: Diagnosis not present

## 2023-01-03 DIAGNOSIS — I25118 Atherosclerotic heart disease of native coronary artery with other forms of angina pectoris: Secondary | ICD-10-CM | POA: Diagnosis not present

## 2023-01-03 DIAGNOSIS — E7849 Other hyperlipidemia: Secondary | ICD-10-CM | POA: Diagnosis not present

## 2023-01-03 DIAGNOSIS — I444 Left anterior fascicular block: Secondary | ICD-10-CM | POA: Diagnosis not present

## 2023-01-03 DIAGNOSIS — R001 Bradycardia, unspecified: Secondary | ICD-10-CM | POA: Diagnosis not present

## 2023-01-18 NOTE — Progress Notes (Signed)
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  She underwent surgical resection. She completed her 6 months of capecitibine by the end of 2023.   Iron deficiency anemia due to chronic blood loss Iron studies from July, 2024 were normal and her hemoglobin had come up from 10.3 to 11.6.   Macrocytic anemia Her anemia was macrocytic. Her hemoglobin was slowly dropping, down to 10.6, then went up to 11.3 but dropped to 10.3 in April,2024. Folate was normal but B12 was quite low at 166 so she was placed on B12 injections monthly. She does tell me her mother had to have B12 injections.  She notes that she feels better on the B-12 injections.  A repeat level in April was 506 despite missing an injection, so we stopped the B12 injections and have repeated the level today.  Plan: She informed me that she is wearing a heart monitor for the next 2 weeks for her 7 year check up by her cardiologist. She also informed me that she will have a echocardiogram sometime after her 2 weeks on the heart monitor. I need to refer her to Dr. Jennye Boroughs for follow-up colonoscopy. Her labs today are pending. We stopped her B-12 injection last visit. I will see her back in 3 months with CBC, CMP, CEA and possibly B-12 depending on her labs today. The patient understands the plans discussed today and is in agreement with them.  She knows to contact our office if she develops concerns prior to her next appointment.  I provided 82 minutes of face-to-face time during this encounter and > 50% was spent counseling as documented under my assessment and plan.    Dellia Beckwith, MD  [facility]  [facility] [address] [place] [address] Dept: [phone] Dept Fax: [phone]   No orders of the defined types were placed in this encounter.   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)  08/11/2021 Cancer Staging   Staging form: Colon and Rectum, AJCC 8th Edition - Clinical stage from 08/11/2021: Stage IIIC (cT3, cN2b, cM0) - Signed by Dellia Beckwith, MD on 09/14/2021 Histopathologic type: Adenocarcinoma, NOS Stage prefix: Initial diagnosis Total positive nodes: 12 Total nodes examined: 32 Histologic grade (G): G3 Histologic grading system: 4 grade system Laterality: Left Tumor size (mm): 63 Lymph-vascular invasion (LVI): LVI present/identified, NOS Diagnostic confirmation: Positive histology Specimen type: Excision Staged by: Managing physician Tumor deposits (TD): Absent Carcinoembryonic antigen (CEA) (ng/mL): 3.6 Perineural invasion (PNI): Unknown Microsatellite instability (MSI): Stable KRAS mutation: Not assessed NRAS mutation: Not assessed BRAF mutation: Not assessed Stage used in treatment planning: Yes National guidelines used in treatment planning: Yes Type of national guideline used in treatment planning: NCCN Staging comments: Over 68 years old, will use capecitabine at reduced dose as tolerated   08/25/2021 Initial Diagnosis   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 Patient states that she feels well and has no complaints of pain. She informed me that she is wearing a heart monitor for the  next 2 weeks for her 82yr check up by her cardiologist. She also informed me that she will have a echocardiogram sometime after her 2 weeks on the heart monitor. I need to refer her to Dr. Jennye Boroughs for follow-up colonoscopy. Her labs today are pending. We stopped her B-12 injection last visit. I will see her back in 3 months with CBC, CMP, CEA and possibly B-12 depending on her labs today.  She denies signs of infection such as sore throat, sinus drainage, cough, or urinary symptoms.  She denies fevers or recurrent chills. She denies pain. She denies nausea, vomiting, chest pain, dyspnea or cough. Her appetite is good and her weight has increased 2 pounds over last 3 months .   REVIEW OF SYSTEMS:  Review of Systems  Constitutional: Negative.  Negative for appetite change, chills, diaphoresis, fatigue, fever and unexpected weight change.  HENT:  Negative.  Negative for hearing loss, lump/mass, mouth sores, nosebleeds, sore throat, tinnitus, trouble swallowing and voice change.   Eyes: Negative.  Negative for eye problems and icterus.  Respiratory: Negative.  Negative for chest tightness, cough, hemoptysis, shortness of breath and wheezing.   Cardiovascular: Negative.  Negative for chest pain, leg swelling and palpitations.  Gastrointestinal: Negative.  Negative for abdominal distention, abdominal pain, blood in stool, constipation, diarrhea, nausea, rectal pain and vomiting.  Endocrine: Negative.  Negative for hot flashes.  Genitourinary: Negative.  Negative for bladder incontinence, difficulty urinating, dyspareunia, dysuria, frequency, hematuria, menstrual problem, nocturia, pelvic pain, vaginal bleeding and vaginal discharge.   Musculoskeletal: Negative.  Negative for arthralgias, back pain, flank pain, gait problem, myalgias, neck pain and neck stiffness.  Skin: Negative.  Negative for itching, rash and wound.  Neurological: Negative.  Negative for dizziness, extremity weakness, gait problem,  headaches, light-headedness, numbness, seizures and speech difficulty.  Hematological: Negative.  Negative for adenopathy. Does not bruise/bleed easily.  Psychiatric/Behavioral: Negative.  Negative for confusion, decreased concentration, depression, sleep disturbance and suicidal ideas. The patient is not nervous/anxious.      VITALS:  Blood pressure (!) 168/75, pulse (!) 56, temperature 98.1 F (36.7 C), resp. rate 18, height 5' 1.7" (1.567 m), weight 122 lb 11.2 oz (55.7 kg), SpO2 99%.  Wt Readings from Last 3 Encounters:  01/19/23 122 lb 11.2 oz (55.7 kg)  10/17/22 120 lb 6.4 oz (54.6 kg)  08/30/22 130 lb 1.3 oz (59 kg)    Body mass index is 22.66 kg/m.  Performance status (ECOG): 1 - Symptomatic but completely ambulatory  PHYSICAL EXAM:  Physical Exam Vitals and nursing note reviewed.  Constitutional:      General: She is not in acute distress.    Appearance: Normal appearance. She is normal weight. She is not ill-appearing, toxic-appearing or diaphoretic.  HENT:     Head: Normocephalic and atraumatic.     Right Ear: Tympanic membrane, ear canal and external ear normal. There is no impacted cerumen.     Left Ear: Tympanic membrane, ear canal and external ear normal. There is no impacted cerumen.     Nose: Nose normal. No congestion or rhinorrhea.     Mouth/Throat:     Mouth: Mucous membranes are moist.     Pharynx: Oropharynx is clear. No oropharyngeal exudate or  posterior oropharyngeal erythema.  Eyes:     General: No scleral icterus.       Right eye: No discharge.        Left eye: No discharge.     Extraocular Movements: Extraocular movements intact.     Conjunctiva/sclera: Conjunctivae normal.     Pupils: Pupils are equal, round, and reactive to light.  Neck:     Vascular: No carotid bruit.  Cardiovascular:     Rate and Rhythm: Normal rate and regular rhythm.     Pulses: Normal pulses.     Heart sounds: Normal heart sounds. No murmur heard.    No friction rub. No  gallop.  Pulmonary:     Effort: Pulmonary effort is normal. No respiratory distress.     Breath sounds: Normal breath sounds. No stridor. No wheezing, rhonchi or rales.  Chest:     Chest wall: No tenderness.     Comments: Heart monitor in place in the left upper chest Abdominal:     General: Bowel sounds are normal. There is no distension.     Palpations: Abdomen is soft. There is no hepatomegaly, splenomegaly or mass.     Tenderness: There is no abdominal tenderness. There is no right CVA tenderness, left CVA tenderness, guarding or rebound.     Hernia: No hernia is present.  Musculoskeletal:        General: No swelling, tenderness, deformity or signs of injury. Normal range of motion.     Cervical back: Normal range of motion and neck supple. No rigidity or tenderness.     Right lower leg: No edema.     Left lower leg: No edema.  Lymphadenopathy:     Cervical: No cervical adenopathy.     Upper Body:     Right upper body: No supraclavicular or axillary adenopathy.     Left upper body: No supraclavicular or axillary adenopathy.     Lower Body: No right inguinal adenopathy. No left inguinal adenopathy.  Skin:    General: Skin is warm and dry.     Coloration: Skin is not jaundiced or pale.     Findings: No bruising, ecchymosis, erythema, lesion or rash.  Neurological:     General: No focal deficit present.     Mental Status: She is alert and oriented to person, place, and time. Mental status is at baseline.     Cranial Nerves: No cranial nerve deficit.     Sensory: No sensory deficit.     Motor: No weakness.     Coordination: Coordination normal.     Gait: Gait normal.     Deep Tendon Reflexes: Reflexes normal.  Psychiatric:        Mood and Affect: Mood normal.        Behavior: Behavior normal.        Thought Content: Thought content normal.        Judgment: Judgment normal.     LABS:      Latest Ref Rng & Units 01/19/2023    2:17 PM 10/17/2022    1:23 PM 07/18/2022     1:17 PM  CBC  WBC 4.0 - 10.5 K/uL 5.7  6.2  5.0   Hemoglobin 12.0 - 15.0 g/dL 78.2  95.6  21.3   Hematocrit 36.0 - 46.0 % 36.1  36.2  32.1   Platelets 150 - 400 K/uL 180  194  249       Latest Ref Rng & Units 01/19/2023    2:17 PM  10/17/2022    1:23 PM 07/18/2022    1:17 PM  CMP  Glucose 70 - 99 mg/dL 73  86  88   BUN 8 - 23 mg/dL 27  31  21    Creatinine 0.44 - 1.00 mg/dL 1.02  7.25  3.66   Sodium 135 - 145 mmol/L 141  140  139   Potassium 3.5 - 5.1 mmol/L 3.7  3.7  3.8   Chloride 98 - 111 mmol/L 108  109  108   CO2 22 - 32 mmol/L 25  22  23    Calcium 8.9 - 10.3 mg/dL 9.1  9.3  8.5   Total Protein 6.5 - 8.1 g/dL 6.2  6.8  6.3   Total Bilirubin 0.3 - 1.2 mg/dL 0.7  0.7  0.7   Alkaline Phos 38 - 126 U/L 49  56  50   AST 15 - 41 U/L 40  28  32   ALT 0 - 44 U/L 34  24  18    Component Ref Range & Units 2 mo ago  Folate >5.9 ng/mL 10.2    Lab Results  Component Value Date   CEA1 1.7 01/19/2023   /  CEA  Date Value Ref Range Status  01/19/2023 1.7 0.0 - 4.7 ng/mL Final    Comment:    (NOTE)                             Nonsmokers          <3.9                             Smokers             <5.6 [employer] Electrochemiluminescence Immunoassay (ECLIA) Values obtained with different assay methods or kits cannot be used interchangeably.  Results cannot be interpreted as absolute evidence of the presence or absence of malignant disease. Performed At: [facility] [address] [place], [place] [id number] Jolene Schimke MD ZD:[id number]    No results found for: "PSA1" No results found for: "CAN199" No results found for: "CAN125"  No results found for: "TOTALPROTELP", "ALBUMINELP", "A1GS", "A2GS", "BETS", "BETA2SER", "GAMS", "MSPIKE", "SPEI" Lab Results  Component Value Date   TIBC 311 10/17/2022   FERRITIN 53 10/17/2022   IRONPCTSAT 20 10/17/2022   No results found for: "LDH"  STUDIES:  No results found.     HISTORY:   Past Medical History:   Diagnosis Date   Arthritis    B12 deficiency anemia 01/26/2022   HTN (hypertension)    Hypothyroidism    Osteoporosis     Past Surgical History:  Procedure Laterality Date   BLADDER SURGERY     COLON SURGERY     eyelid surgery      Family History  Problem Relation Age of Onset   Colon cancer Mother    Prostate cancer Brother     Social History:  reports that she has never smoked. She has never used smokeless tobacco. She reports that she does not drink alcohol and does not use drugs.The patient is alone today.  Allergies: No Known Allergies  Current Medications: Current Outpatient Medications  Medication Sig Dispense Refill   aspirin 81 MG chewable tablet Chew by mouth.     carvedilol (COREG) 3.125 MG tablet Take 3.125 mg by mouth 2 (two) times daily.     Cholecalciferol 25 MCG (1000 UT)  capsule Take by mouth.     fluticasone (FLONASE) 50 MCG/ACT nasal spray Place 2 sprays into both nostrils daily.     furosemide (LASIX) 20 MG tablet Take 20 mg by mouth daily.     hydroxychloroquine (PLAQUENIL) 200 MG tablet Take 200 mg by mouth 2 (two) times daily.     nitroGLYCERIN (NITROSTAT) 0.4 MG SL tablet Place under the tongue.     predniSONE (DELTASONE) 10 MG tablet Take 10 mg by mouth daily as needed.     PROLIA 60 MG/ML SOSY injection Inject into the skin.     rosuvastatin (CRESTOR) 20 MG tablet Take 20 mg by mouth at bedtime.     SYNTHROID 125 MCG tablet Take 125 mcg by mouth daily.     No current facility-administered medications for this visit.    I,Jasmine M Ord,acting as a scribe for Dellia Beckwith, MD.,have documented all relevant documentation on the behalf of Dellia Beckwith, MD,as directed by  Dellia Beckwith, MD while in the presence of Dellia Beckwith, MD.

## 2023-01-19 ENCOUNTER — Encounter: Payer: Self-pay | Admitting: Oncology

## 2023-01-19 ENCOUNTER — Inpatient Hospital Stay (HOSPITAL_BASED_OUTPATIENT_CLINIC_OR_DEPARTMENT_OTHER): Payer: PPO | Admitting: Oncology

## 2023-01-19 ENCOUNTER — Other Ambulatory Visit: Payer: Self-pay | Admitting: Oncology

## 2023-01-19 ENCOUNTER — Inpatient Hospital Stay: Payer: PPO | Attending: Oncology

## 2023-01-19 VITALS — BP 168/75 | HR 56 | Temp 98.1°F | Resp 18 | Ht 61.7 in | Wt 122.7 lb

## 2023-01-19 DIAGNOSIS — D500 Iron deficiency anemia secondary to blood loss (chronic): Secondary | ICD-10-CM | POA: Diagnosis not present

## 2023-01-19 DIAGNOSIS — C772 Secondary and unspecified malignant neoplasm of intra-abdominal lymph nodes: Secondary | ICD-10-CM

## 2023-01-19 DIAGNOSIS — C186 Malignant neoplasm of descending colon: Secondary | ICD-10-CM | POA: Insufficient documentation

## 2023-01-19 DIAGNOSIS — E538 Deficiency of other specified B group vitamins: Secondary | ICD-10-CM | POA: Insufficient documentation

## 2023-01-19 DIAGNOSIS — D510 Vitamin B12 deficiency anemia due to intrinsic factor deficiency: Secondary | ICD-10-CM | POA: Diagnosis not present

## 2023-01-19 LAB — CMP (CANCER CENTER ONLY)
ALT: 34 U/L (ref 0–44)
AST: 40 U/L (ref 15–41)
Albumin: 3.9 g/dL (ref 3.5–5.0)
Alkaline Phosphatase: 49 U/L (ref 38–126)
Anion gap: 8 (ref 5–15)
BUN: 27 mg/dL — ABNORMAL HIGH (ref 8–23)
CO2: 25 mmol/L (ref 22–32)
Calcium: 9.1 mg/dL (ref 8.9–10.3)
Chloride: 108 mmol/L (ref 98–111)
Creatinine: 1.14 mg/dL — ABNORMAL HIGH (ref 0.44–1.00)
GFR, Estimated: 48 mL/min — ABNORMAL LOW (ref >60)
Glucose, Bld: 73 mg/dL (ref 70–99)
Potassium: 3.7 mmol/L (ref 3.5–5.1)
Sodium: 141 mmol/L (ref 135–145)
Total Bilirubin: 0.7 mg/dL (ref 0.3–1.2)
Total Protein: 6.2 g/dL — ABNORMAL LOW (ref 6.5–8.1)

## 2023-01-19 LAB — CBC WITH DIFFERENTIAL (CANCER CENTER ONLY)
Abs Immature Granulocytes: 0.02 10*3/uL (ref 0.00–0.07)
Basophils Absolute: 0.1 10*3/uL (ref 0.0–0.1)
Basophils Relative: 1 %
Eosinophils Absolute: 0 10*3/uL (ref 0.0–0.5)
Eosinophils Relative: 0 %
HCT: 36.1 % (ref 36.0–46.0)
Hemoglobin: 11.2 g/dL — ABNORMAL LOW (ref 12.0–15.0)
Immature Granulocytes: 0 %
Lymphocytes Relative: 26 %
Lymphs Abs: 1.5 10*3/uL (ref 0.7–4.0)
MCH: 30.9 pg (ref 26.0–34.0)
MCHC: 31 g/dL (ref 30.0–36.0)
MCV: 99.7 fL (ref 80.0–100.0)
Monocytes Absolute: 0.7 10*3/uL (ref 0.1–1.0)
Monocytes Relative: 12 %
Neutro Abs: 3.4 10*3/uL (ref 1.7–7.7)
Neutrophils Relative %: 61 %
Platelet Count: 180 10*3/uL (ref 150–400)
RBC: 3.62 MIL/uL — ABNORMAL LOW (ref 3.87–5.11)
RDW: 13.1 % (ref 11.5–15.5)
WBC Count: 5.7 10*3/uL (ref 4.0–10.5)
nRBC: 0 % (ref 0.0–0.2)

## 2023-01-19 LAB — VITAMIN B12: Vitamin B-12: 279 pg/mL (ref 180–914)

## 2023-01-20 LAB — CEA: CEA: 1.7 ng/mL (ref 0.0–4.7)

## 2023-01-23 ENCOUNTER — Telehealth: Payer: Self-pay | Admitting: Oncology

## 2023-01-23 NOTE — Telephone Encounter (Signed)
Contacted pt to schedule an appt. Unable to reach via phone, voicemail box was full.   Scheduling Message Entered by Gery Pray H on 01/19/2023 at  2:55 PM Priority: Routine <No visit type provided>  Department: [facility]  Provider:  Scheduling Notes:  RT 3 months with labs

## 2023-01-29 NOTE — Telephone Encounter (Signed)
Patient has been scheduled. Aware of appt date and time  

## 2023-01-30 ENCOUNTER — Ambulatory Visit: Payer: PPO

## 2023-02-01 DIAGNOSIS — M810 Age-related osteoporosis without current pathological fracture: Secondary | ICD-10-CM | POA: Diagnosis not present

## 2023-02-01 DIAGNOSIS — R600 Localized edema: Secondary | ICD-10-CM | POA: Diagnosis not present

## 2023-02-01 DIAGNOSIS — C186 Malignant neoplasm of descending colon: Secondary | ICD-10-CM | POA: Diagnosis not present

## 2023-02-01 DIAGNOSIS — Z79899 Other long term (current) drug therapy: Secondary | ICD-10-CM | POA: Diagnosis not present

## 2023-02-01 DIAGNOSIS — H04123 Dry eye syndrome of bilateral lacrimal glands: Secondary | ICD-10-CM | POA: Diagnosis not present

## 2023-02-01 DIAGNOSIS — Z6821 Body mass index (BMI) 21.0-21.9, adult: Secondary | ICD-10-CM | POA: Diagnosis not present

## 2023-02-01 DIAGNOSIS — N1831 Chronic kidney disease, stage 3a: Secondary | ICD-10-CM | POA: Diagnosis not present

## 2023-02-01 DIAGNOSIS — M359 Systemic involvement of connective tissue, unspecified: Secondary | ICD-10-CM | POA: Diagnosis not present

## 2023-02-01 DIAGNOSIS — E785 Hyperlipidemia, unspecified: Secondary | ICD-10-CM | POA: Diagnosis not present

## 2023-02-01 DIAGNOSIS — E039 Hypothyroidism, unspecified: Secondary | ICD-10-CM | POA: Diagnosis not present

## 2023-02-01 DIAGNOSIS — I2510 Atherosclerotic heart disease of native coronary artery without angina pectoris: Secondary | ICD-10-CM | POA: Diagnosis not present

## 2023-02-01 DIAGNOSIS — D539 Nutritional anemia, unspecified: Secondary | ICD-10-CM | POA: Diagnosis not present

## 2023-02-01 DIAGNOSIS — C772 Secondary and unspecified malignant neoplasm of intra-abdominal lymph nodes: Secondary | ICD-10-CM | POA: Diagnosis not present

## 2023-02-01 DIAGNOSIS — I10 Essential (primary) hypertension: Secondary | ICD-10-CM | POA: Diagnosis not present

## 2023-02-07 DIAGNOSIS — I25118 Atherosclerotic heart disease of native coronary artery with other forms of angina pectoris: Secondary | ICD-10-CM | POA: Diagnosis not present

## 2023-02-09 ENCOUNTER — Other Ambulatory Visit: Payer: Self-pay | Admitting: Oncology

## 2023-02-09 DIAGNOSIS — D510 Vitamin B12 deficiency anemia due to intrinsic factor deficiency: Secondary | ICD-10-CM

## 2023-02-12 DIAGNOSIS — I4719 Other supraventricular tachycardia: Secondary | ICD-10-CM | POA: Diagnosis not present

## 2023-02-12 DIAGNOSIS — I25118 Atherosclerotic heart disease of native coronary artery with other forms of angina pectoris: Secondary | ICD-10-CM | POA: Diagnosis not present

## 2023-02-14 DIAGNOSIS — Z9181 History of falling: Secondary | ICD-10-CM | POA: Diagnosis not present

## 2023-02-14 DIAGNOSIS — Z0000 Encounter for general adult medical examination without abnormal findings: Secondary | ICD-10-CM | POA: Diagnosis not present

## 2023-02-15 DIAGNOSIS — M810 Age-related osteoporosis without current pathological fracture: Secondary | ICD-10-CM | POA: Diagnosis not present

## 2023-03-02 ENCOUNTER — Ambulatory Visit: Payer: PPO

## 2023-04-02 ENCOUNTER — Ambulatory Visit: Payer: PPO

## 2023-04-24 ENCOUNTER — Ambulatory Visit: Payer: PPO | Admitting: Oncology

## 2023-04-24 ENCOUNTER — Other Ambulatory Visit: Payer: PPO

## 2023-04-25 ENCOUNTER — Other Ambulatory Visit: Payer: Self-pay | Admitting: Oncology

## 2023-04-25 ENCOUNTER — Inpatient Hospital Stay: Payer: PPO | Attending: Oncology

## 2023-04-25 ENCOUNTER — Other Ambulatory Visit: Payer: Self-pay

## 2023-04-25 ENCOUNTER — Inpatient Hospital Stay (HOSPITAL_BASED_OUTPATIENT_CLINIC_OR_DEPARTMENT_OTHER): Payer: PPO | Admitting: Oncology

## 2023-04-25 ENCOUNTER — Telehealth: Payer: Self-pay | Admitting: Oncology

## 2023-04-25 VITALS — BP 148/65 | HR 57 | Temp 97.5°F | Resp 18 | Ht 61.7 in | Wt 119.7 lb

## 2023-04-25 DIAGNOSIS — R7989 Other specified abnormal findings of blood chemistry: Secondary | ICD-10-CM | POA: Insufficient documentation

## 2023-04-25 DIAGNOSIS — D510 Vitamin B12 deficiency anemia due to intrinsic factor deficiency: Secondary | ICD-10-CM

## 2023-04-25 DIAGNOSIS — C186 Malignant neoplasm of descending colon: Secondary | ICD-10-CM | POA: Insufficient documentation

## 2023-04-25 DIAGNOSIS — D539 Nutritional anemia, unspecified: Secondary | ICD-10-CM | POA: Insufficient documentation

## 2023-04-25 DIAGNOSIS — Z800 Family history of malignant neoplasm of digestive organs: Secondary | ICD-10-CM | POA: Diagnosis not present

## 2023-04-25 DIAGNOSIS — C772 Secondary and unspecified malignant neoplasm of intra-abdominal lymph nodes: Secondary | ICD-10-CM

## 2023-04-25 DIAGNOSIS — D500 Iron deficiency anemia secondary to blood loss (chronic): Secondary | ICD-10-CM | POA: Diagnosis not present

## 2023-04-25 DIAGNOSIS — Z8042 Family history of malignant neoplasm of prostate: Secondary | ICD-10-CM | POA: Diagnosis not present

## 2023-04-25 LAB — CBC WITH DIFFERENTIAL (CANCER CENTER ONLY)
Abs Immature Granulocytes: 0.02 10*3/uL (ref 0.00–0.07)
Basophils Absolute: 0.1 10*3/uL (ref 0.0–0.1)
Basophils Relative: 2 %
Eosinophils Absolute: 0 10*3/uL (ref 0.0–0.5)
Eosinophils Relative: 0 %
HCT: 33.9 % — ABNORMAL LOW (ref 36.0–46.0)
Hemoglobin: 11.3 g/dL — ABNORMAL LOW (ref 12.0–15.0)
Immature Granulocytes: 0 %
Lymphocytes Relative: 26 %
Lymphs Abs: 1.4 10*3/uL (ref 0.7–4.0)
MCH: 30.4 pg (ref 26.0–34.0)
MCHC: 33.3 g/dL (ref 30.0–36.0)
MCV: 91.1 fL (ref 80.0–100.0)
Monocytes Absolute: 0.7 10*3/uL (ref 0.1–1.0)
Monocytes Relative: 12 %
Neutro Abs: 3.3 10*3/uL (ref 1.7–7.7)
Neutrophils Relative %: 60 %
Platelet Count: 229 10*3/uL (ref 150–400)
RBC: 3.72 MIL/uL — ABNORMAL LOW (ref 3.87–5.11)
RDW: 13 % (ref 11.5–15.5)
WBC Count: 5.5 10*3/uL (ref 4.0–10.5)
nRBC: 0 % (ref 0.0–0.2)
nRBC: 0 /100{WBCs}

## 2023-04-25 LAB — CMP (CANCER CENTER ONLY)
ALT: 34 U/L (ref 0–44)
AST: 54 U/L — ABNORMAL HIGH (ref 15–41)
Albumin: 4.2 g/dL (ref 3.5–5.0)
Alkaline Phosphatase: 69 U/L (ref 38–126)
Anion gap: 12 (ref 5–15)
BUN: 26 mg/dL — ABNORMAL HIGH (ref 8–23)
CO2: 25 mmol/L (ref 22–32)
Calcium: 9.4 mg/dL (ref 8.9–10.3)
Chloride: 105 mmol/L (ref 98–111)
Creatinine: 1.4 mg/dL — ABNORMAL HIGH (ref 0.44–1.00)
GFR, Estimated: 37 mL/min — ABNORMAL LOW (ref >60)
Glucose, Bld: 86 mg/dL (ref 70–99)
Potassium: 4.1 mmol/L (ref 3.5–5.1)
Sodium: 142 mmol/L (ref 135–145)
Total Bilirubin: 0.5 mg/dL (ref 0.0–1.2)
Total Protein: 6.5 g/dL (ref 6.5–8.1)

## 2023-04-25 LAB — VITAMIN B12: Vitamin B-12: 445 pg/mL (ref 180–914)

## 2023-04-25 LAB — IRON AND TIBC
Iron: 93 ug/dL (ref 28–170)
Saturation Ratios: 29 % (ref 10.4–31.8)
TIBC: 322 ug/dL (ref 250–450)
UIBC: 229 ug/dL

## 2023-04-25 LAB — CEA (ACCESS): CEA (CHCC): <1 ng/mL (ref 0.00–5.00)

## 2023-04-25 LAB — FERRITIN: Ferritin: 82 ng/mL (ref 11–307)

## 2023-04-25 NOTE — Telephone Encounter (Signed)
04/25/23 Next appt scheduled and confirmed with patient

## 2023-04-25 NOTE — Progress Notes (Signed)
[facility]  [address] 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  She underwent surgical resection. She completed her 6 months of capecitibine by the end of 2023.   Iron deficiency anemia due to chronic blood loss Iron studies from July, 2024 were normal and her hemoglobin had come up from 10.3 to 11.6.   Macrocytic anemia Her anemia was macrocytic. Her hemoglobin was slowly dropping, down to 10.6, then went up to 11.3 but dropped to 10.3 in April,2024. Folate was normal but B12 was quite low at 166 so she was placed on B12 injections monthly. She does tell me her mother had to have B12 injections.  She notes that she feels better on the B-12 injections.  A repeat level in April was 506 despite missing an injection, so we stopped the B12 injections and have repeated the level today.  Plan: She informed me that her colonoscopy has been scheduled for 06/20/2023. She has a WBC of 5.5, low hemoglobin of 11.3, and platelet count of 229,000. She also has a elevated creatinine of 1.40 with a BUN of 26 and AST of 54. The rest of her CMP is normal. Her CEA, B-12, iron, TIBC, and ferritin today are pending. She does take Crestor 20mg  once at bedtime and lasix 20mg  three times a week. I advised she cut back on the Lasix and increase her fluid intake. I think her liver transaminases just need to be monitored. I will see her back in 3 months with CBC, CMP, and CEA.  The patient understands the plans discussed today and is in agreement with them.  She knows to contact our office if she develops concerns prior to her next appointment.  I provided 13 minutes of face-to-face time during this encounter and > 50% was spent counseling as documented under my  assessment and plan.    Dellia Beckwith, MD  [facility] [address] [place] [address] Dept: [phone] Dept Fax: [phone]   No orders of the defined types were placed in this encounter.   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)  08/11/2021 Cancer Staging   Staging form: Colon and Rectum, AJCC 8th Edition - Clinical stage from 08/11/2021: Stage IIIC (cT3, cN2b, cM0) - Signed by Dellia Beckwith, MD on 09/14/2021 Histopathologic type: Adenocarcinoma, NOS Stage prefix: Initial diagnosis Total positive nodes: 12 Total nodes examined: 32 Histologic grade (G): G3 Histologic grading system: 4 grade system Laterality: Left Tumor size (mm): 63 Lymph-vascular invasion (LVI): LVI present/identified, NOS Diagnostic confirmation: Positive histology Specimen type: Excision Staged by: Managing physician Tumor deposits (TD): Absent Carcinoembryonic antigen (CEA) (ng/mL): 3.6 Perineural invasion (PNI): Unknown Microsatellite instability (MSI): Stable KRAS mutation: Not assessed NRAS mutation: Not assessed BRAF mutation: Not assessed Stage used in treatment planning: Yes National guidelines used in treatment planning: Yes Type of national guideline used in treatment planning: NCCN Staging comments: Over 39 years old, will use capecitabine at reduced dose as tolerated   08/25/2021 Initial Diagnosis   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)   09/21/2021 -  09/21/2021 Chemotherapy   Patient is on Treatment Plan : COLORECTAL Capecitabine q21d         INTERVAL HISTORY:  Jenna Hutchinson is here today for repeat clinical assessment for her stage IIIC colon cancer. Patient states that she feels well and has no complaints of  pain. She informed me that her colonoscopy has been scheduled for 06/20/2023. She has a WBC of 5.5, low hemoglobin of 11.3, and platelet count of 229,000. She also has a elevated creatinine of 1.40 with a BUN of 26 and AST of 54. The rest of her CMP is normal. Her CEA, B-12, iron, TIBC, and ferritin today are pending. She does take Crestor 20mg  once at bedtime and lasix 20mg  three times a week. I advised she cut back on the Lasix and increase her fluid intake. I think her liver transaminases just need to be monitored. I will see her back in 3 months with CBC, CMP, and CEA.   She denies signs of infection such as sore throat, sinus drainage, cough, or urinary symptoms.  She denies fevers or recurrent chills. She denies pain. She denies nausea, vomiting, chest pain, dyspnea or cough. Her appetite is good and her weight has decreased 3 pounds over last 3 months .    REVIEW OF SYSTEMS:  Review of Systems  Constitutional: Negative.  Negative for appetite change, chills, diaphoresis, fatigue, fever and unexpected weight change.  HENT:  Negative.  Negative for hearing loss, lump/mass, mouth sores, nosebleeds, sore throat, tinnitus, trouble swallowing and voice change.   Eyes: Negative.  Negative for eye problems and icterus.  Respiratory: Negative.  Negative for chest tightness, cough, hemoptysis, shortness of breath and wheezing.   Cardiovascular: Negative.  Negative for chest pain, leg swelling and palpitations.  Gastrointestinal: Negative.  Negative for abdominal distention, abdominal pain, blood in stool, constipation, diarrhea, nausea, rectal pain and vomiting.  Endocrine: Negative.  Negative for hot flashes.  Genitourinary: Negative.  Negative for bladder incontinence, difficulty urinating, dyspareunia, dysuria, frequency, hematuria, menstrual problem, nocturia, pelvic pain, vaginal bleeding and vaginal discharge.   Musculoskeletal: Negative.  Negative for arthralgias, back pain, flank pain, gait  problem, myalgias, neck pain and neck stiffness.  Skin: Negative.  Negative for itching, rash and wound.  Neurological: Negative.  Negative for dizziness, extremity weakness, gait problem, headaches, light-headedness, numbness, seizures and speech difficulty.  Hematological: Negative.  Negative for adenopathy. Does not bruise/bleed easily.  Psychiatric/Behavioral: Negative.  Negative for confusion, decreased concentration, depression, sleep disturbance and suicidal ideas. The patient is not nervous/anxious.      VITALS:  Blood pressure (!) 148/65, pulse (!) 57, temperature (!) 97.5 F (36.4 C), temperature source Oral, resp. rate 18, height 5' 1.7" (1.567 m), weight 119 lb 11.2 oz (54.3 kg), SpO2 100%.  Wt Readings from Last 3 Encounters:  04/25/23 119 lb 11.2 oz (54.3 kg)  01/19/23 122 lb 11.2 oz (55.7 kg)  10/17/22 120 lb 6.4 oz (54.6 kg)    Body mass index is 22.11 kg/m.  Performance status (ECOG): 1 - Symptomatic but completely ambulatory  PHYSICAL EXAM:  Physical Exam Vitals and nursing note reviewed.  Constitutional:      General: She is not in acute distress.    Appearance: Normal appearance. She is normal weight. She is not ill-appearing, toxic-appearing or diaphoretic.  HENT:     Head: Normocephalic and atraumatic.     Right Ear: Tympanic membrane, ear canal and external ear normal. There is no impacted cerumen.     Left Ear: Tympanic  membrane, ear canal and external ear normal. There is no impacted cerumen.     Nose: Nose normal. No congestion or rhinorrhea.     Mouth/Throat:     Mouth: Mucous membranes are moist.     Pharynx: Oropharynx is clear. No oropharyngeal exudate or posterior oropharyngeal erythema.  Eyes:     General: No scleral icterus.       Right eye: No discharge.        Left eye: No discharge.     Extraocular Movements: Extraocular movements intact.     Conjunctiva/sclera: Conjunctivae normal.     Pupils: Pupils are equal, round, and reactive to light.   Neck:     Vascular: No carotid bruit.  Cardiovascular:     Rate and Rhythm: Normal rate and regular rhythm.     Pulses: Normal pulses.     Heart sounds: Normal heart sounds. No murmur heard.    No friction rub. No gallop.  Pulmonary:     Effort: Pulmonary effort is normal. No respiratory distress.     Breath sounds: Normal breath sounds. No stridor. No wheezing, rhonchi or rales.  Chest:     Chest wall: No tenderness.     Comments: Heart monitor in place in the left upper chest Abdominal:     General: Bowel sounds are normal. There is no distension.     Palpations: Abdomen is soft. There is no hepatomegaly, splenomegaly or mass.     Tenderness: There is no abdominal tenderness. There is no right CVA tenderness, left CVA tenderness, guarding or rebound.     Hernia: No hernia is present.  Musculoskeletal:        General: No swelling, tenderness, deformity or signs of injury. Normal range of motion.     Cervical back: Normal range of motion and neck supple. No rigidity or tenderness.     Right lower leg: No edema.     Left lower leg: No edema.  Lymphadenopathy:     Cervical: No cervical adenopathy.     Upper Body:     Right upper body: No supraclavicular or axillary adenopathy.     Left upper body: No supraclavicular or axillary adenopathy.     Lower Body: No right inguinal adenopathy. No left inguinal adenopathy.  Skin:    General: Skin is warm and dry.     Coloration: Skin is not jaundiced or pale.     Findings: No bruising, ecchymosis, erythema, lesion or rash.  Neurological:     General: No focal deficit present.     Mental Status: She is alert and oriented to person, place, and time. Mental status is at baseline.     Cranial Nerves: No cranial nerve deficit.     Sensory: No sensory deficit.     Motor: No weakness.     Coordination: Coordination normal.     Gait: Gait normal.     Deep Tendon Reflexes: Reflexes normal.  Psychiatric:        Mood and Affect: Mood normal.         Behavior: Behavior normal.        Thought Content: Thought content normal.        Judgment: Judgment normal.    LABS:      Latest Ref Rng & Units 04/25/2023   12:47 PM 01/19/2023    2:17 PM 10/17/2022    1:23 PM  CBC  WBC 4.0 - 10.5 K/uL 5.5  5.7  6.2   Hemoglobin 12.0 - 15.0  g/dL 86.5  78.4  69.6   Hematocrit 36.0 - 46.0 % 33.9  36.1  36.2   Platelets 150 - 400 K/uL 229  180  194       Latest Ref Rng & Units 04/25/2023   12:47 PM 01/19/2023    2:17 PM 10/17/2022    1:23 PM  CMP  Glucose 70 - 99 mg/dL 86  73  86   BUN 8 - 23 mg/dL 26  27  31    Creatinine 0.44 - 1.00 mg/dL 2.95  2.84  1.32   Sodium 135 - 145 mmol/L 142  141  140   Potassium 3.5 - 5.1 mmol/L 4.1  3.7  3.7   Chloride 98 - 111 mmol/L 105  108  109   CO2 22 - 32 mmol/L 25  25  22    Calcium 8.9 - 10.3 mg/dL 9.4  9.1  9.3   Total Protein 6.5 - 8.1 g/dL 6.5  6.2  6.8   Total Bilirubin 0.0 - 1.2 mg/dL 0.5  0.7  0.7   Alkaline Phos 38 - 126 U/L 69  49  56   AST 15 - 41 U/L 54  40  28   ALT 0 - 44 U/L 34  34  24    Lab Results  Component Value Date   CEA1 1.7 01/19/2023   CEA <1.00 04/25/2023   /  CEA  Date Value Ref Range Status  01/19/2023 1.7 0.0 - 4.7 ng/mL Final    Comment:    (NOTE)                             Nonsmokers          <3.9                             Smokers             <5.6 Roche [employer] Electrochemiluminescence Immunoassay (ECLIA) Values obtained with different assay methods or kits cannot be used interchangeably.  Results cannot be interpreted as absolute evidence of the presence or absence of malignant disease. Performed At: [facility] [address] [place], [place] [id number] Jolene Schimke MD DG:[id number]    CEA [facility])  Date Value Ref Range Status  04/25/2023 <1.00 0.00 - 5.00 ng/mL Final    Comment:    (NOTE) This test was performed using Beckman Coulter's paramagnetic chemiluminescent immunoassay. Values obtained from different assay methods cannot be  used interchangeably. Please note that up to 8% of patients who smoke may see values 5.1-10.0 ng/ml and 1% of patients who smoke may see CEA levels >10.0 ng/ml. Performed at [facility], [address], [place], [place] [address]    No results found for: "PSA1" No results found for: [phone]" No results found for: "CAN125"  No results found for: "TOTALPROTELP", "ALBUMINELP", "A1GS", "A2GS", "BETS", "BETA2SER", "GAMS", "MSPIKE", "SPEI" Lab Results  Component Value Date   TIBC 322 04/25/2023   TIBC 311 10/17/2022   FERRITIN 82 04/25/2023   FERRITIN 53 10/17/2022   IRONPCTSAT 29 04/25/2023   IRONPCTSAT 20 10/17/2022   Lab Results  Component Value Date   VITAMINB12 445 04/25/2023   No results found for: "LDH"  STUDIES:  No results found.     HISTORY:   Past Medical History:  Diagnosis Date   Arthritis    B12 deficiency anemia 01/26/2022   HTN (  hypertension)    Hypothyroidism    Osteoporosis     Past Surgical History:  Procedure Laterality Date   BLADDER SURGERY     COLON SURGERY     eyelid surgery      Family History  Problem Relation Age of Onset   Colon cancer Mother    Prostate cancer Brother     Social History:  reports that she has never smoked. She has never used smokeless tobacco. She reports that she does not drink alcohol and does not use drugs.The patient is alone today.  Allergies: No Known Allergies  Current Medications: Current Outpatient Medications  Medication Sig Dispense Refill   SYNTHROID 112 MCG tablet Take 112 mcg by mouth daily.     aspirin 81 MG chewable tablet Chew by mouth.     carvedilol (COREG) 3.125 MG tablet Take 3.125 mg by mouth 2 (two) times daily.     Cholecalciferol 25 MCG (1000 UT) capsule Take by mouth.     fluticasone (FLONASE) 50 MCG/ACT nasal spray Place 2 sprays into both nostrils daily.     furosemide (LASIX) 20 MG tablet Take 20 mg by mouth daily.     hydroxychloroquine (PLAQUENIL) 200 MG  tablet Take 200 mg by mouth 2 (two) times daily.     nitroGLYCERIN (NITROSTAT) 0.4 MG SL tablet Place under the tongue.     predniSONE (DELTASONE) 10 MG tablet Take 10 mg by mouth daily as needed.     PROLIA 60 MG/ML SOSY injection Inject into the skin.     rosuvastatin (CRESTOR) 20 MG tablet Take 20 mg by mouth at bedtime.     No current facility-administered medications for this visit.    I,Jasmine M Knipfer,acting as a scribe for Dellia Beckwith, MD.,have documented all relevant documentation on the behalf of Dellia Beckwith, MD,as directed by  Dellia Beckwith, MD while in the presence of Dellia Beckwith, MD.

## 2023-05-02 ENCOUNTER — Encounter: Payer: Self-pay | Admitting: Oncology

## 2023-05-02 ENCOUNTER — Telehealth: Payer: Self-pay

## 2023-05-02 NOTE — Telephone Encounter (Signed)
-----   Message from Dellia Beckwith sent at 05/02/2023  7:25 AM EST ----- Regarding: call Tell her iron and B12 levels are good.  CEA (cancer test) is neg

## 2023-05-02 NOTE — Telephone Encounter (Signed)
Attempted to contact patient. No answer.

## 2023-05-03 ENCOUNTER — Telehealth: Payer: Self-pay

## 2023-05-03 NOTE — Telephone Encounter (Signed)
-----   Message from Dellia Beckwith sent at 05/02/2023  7:25 AM EST ----- Regarding: call Tell her iron and B12 levels are good.  CEA (cancer test) is neg

## 2023-05-03 NOTE — Telephone Encounter (Signed)
Attempted to contact patient. No answer and no VM.

## 2023-06-01 DIAGNOSIS — M810 Age-related osteoporosis without current pathological fracture: Secondary | ICD-10-CM | POA: Diagnosis not present

## 2023-06-01 DIAGNOSIS — C772 Secondary and unspecified malignant neoplasm of intra-abdominal lymph nodes: Secondary | ICD-10-CM | POA: Diagnosis not present

## 2023-06-01 DIAGNOSIS — N1831 Chronic kidney disease, stage 3a: Secondary | ICD-10-CM | POA: Diagnosis not present

## 2023-06-01 DIAGNOSIS — R2689 Other abnormalities of gait and mobility: Secondary | ICD-10-CM | POA: Diagnosis not present

## 2023-06-01 DIAGNOSIS — I10 Essential (primary) hypertension: Secondary | ICD-10-CM | POA: Diagnosis not present

## 2023-06-01 DIAGNOSIS — I2510 Atherosclerotic heart disease of native coronary artery without angina pectoris: Secondary | ICD-10-CM | POA: Diagnosis not present

## 2023-06-01 DIAGNOSIS — C186 Malignant neoplasm of descending colon: Secondary | ICD-10-CM | POA: Diagnosis not present

## 2023-06-01 DIAGNOSIS — E559 Vitamin D deficiency, unspecified: Secondary | ICD-10-CM | POA: Diagnosis not present

## 2023-06-01 DIAGNOSIS — E039 Hypothyroidism, unspecified: Secondary | ICD-10-CM | POA: Diagnosis not present

## 2023-06-01 DIAGNOSIS — E785 Hyperlipidemia, unspecified: Secondary | ICD-10-CM | POA: Diagnosis not present

## 2023-06-01 DIAGNOSIS — R600 Localized edema: Secondary | ICD-10-CM | POA: Diagnosis not present

## 2023-06-18 DIAGNOSIS — R7989 Other specified abnormal findings of blood chemistry: Secondary | ICD-10-CM | POA: Diagnosis not present

## 2023-06-18 DIAGNOSIS — I252 Old myocardial infarction: Secondary | ICD-10-CM | POA: Diagnosis not present

## 2023-07-03 DIAGNOSIS — M6281 Muscle weakness (generalized): Secondary | ICD-10-CM | POA: Diagnosis not present

## 2023-07-03 DIAGNOSIS — R2689 Other abnormalities of gait and mobility: Secondary | ICD-10-CM | POA: Diagnosis not present

## 2023-07-03 DIAGNOSIS — R2681 Unsteadiness on feet: Secondary | ICD-10-CM | POA: Diagnosis not present

## 2023-07-06 DIAGNOSIS — C186 Malignant neoplasm of descending colon: Secondary | ICD-10-CM | POA: Diagnosis not present

## 2023-07-24 ENCOUNTER — Other Ambulatory Visit: Payer: PPO

## 2023-07-24 ENCOUNTER — Ambulatory Visit: Payer: PPO | Admitting: Oncology

## 2023-07-25 DIAGNOSIS — Z955 Presence of coronary angioplasty implant and graft: Secondary | ICD-10-CM | POA: Diagnosis not present

## 2023-07-25 DIAGNOSIS — K644 Residual hemorrhoidal skin tags: Secondary | ICD-10-CM | POA: Diagnosis not present

## 2023-07-25 DIAGNOSIS — K5750 Diverticulosis of both small and large intestine without perforation or abscess without bleeding: Secondary | ICD-10-CM | POA: Diagnosis not present

## 2023-07-25 DIAGNOSIS — Z860100 Personal history of colon polyps, unspecified: Secondary | ICD-10-CM | POA: Diagnosis not present

## 2023-07-25 DIAGNOSIS — Z7982 Long term (current) use of aspirin: Secondary | ICD-10-CM | POA: Diagnosis not present

## 2023-07-25 DIAGNOSIS — Z79899 Other long term (current) drug therapy: Secondary | ICD-10-CM | POA: Diagnosis not present

## 2023-07-25 DIAGNOSIS — D123 Benign neoplasm of transverse colon: Secondary | ICD-10-CM | POA: Diagnosis not present

## 2023-07-25 DIAGNOSIS — I10 Essential (primary) hypertension: Secondary | ICD-10-CM | POA: Diagnosis not present

## 2023-07-25 DIAGNOSIS — D122 Benign neoplasm of ascending colon: Secondary | ICD-10-CM | POA: Diagnosis not present

## 2023-07-25 DIAGNOSIS — K5730 Diverticulosis of large intestine without perforation or abscess without bleeding: Secondary | ICD-10-CM | POA: Diagnosis not present

## 2023-07-25 DIAGNOSIS — Z08 Encounter for follow-up examination after completed treatment for malignant neoplasm: Secondary | ICD-10-CM | POA: Diagnosis not present

## 2023-07-25 DIAGNOSIS — Z800 Family history of malignant neoplasm of digestive organs: Secondary | ICD-10-CM | POA: Diagnosis not present

## 2023-07-25 DIAGNOSIS — D126 Benign neoplasm of colon, unspecified: Secondary | ICD-10-CM | POA: Diagnosis not present

## 2023-07-25 DIAGNOSIS — K648 Other hemorrhoids: Secondary | ICD-10-CM | POA: Diagnosis not present

## 2023-07-25 DIAGNOSIS — K6389 Other specified diseases of intestine: Secondary | ICD-10-CM | POA: Diagnosis not present

## 2023-07-25 DIAGNOSIS — K635 Polyp of colon: Secondary | ICD-10-CM | POA: Diagnosis not present

## 2023-07-25 DIAGNOSIS — I252 Old myocardial infarction: Secondary | ICD-10-CM | POA: Diagnosis not present

## 2023-07-25 DIAGNOSIS — Z85038 Personal history of other malignant neoplasm of large intestine: Secondary | ICD-10-CM | POA: Diagnosis not present

## 2023-07-25 LAB — HM COLONOSCOPY

## 2023-07-30 IMAGING — CT CT VIRTUAL COLONOSCOPY DIAGNOSTIC
4 of 13 series · 11 of 46 positions shown, 17 images · non-contrast
Comparison: 12/27/2012

CLINICAL DATA: Cancer of descending colon (HCC)

EXAM:
CT VIRTUAL COLONOSCOPY DIAGNOSTIC
TECHNIQUE: The patient was given a standard bowel preparation with Gastrografin
and barium for fluid and stool tagging respectively. The quality of
the bowel preparation is is fair. Automated CO2 insufflation of the
colon was performed prior to image acquisition and colonic
distention is fair. Image post processing was used to generate a 3D
endoluminal fly-through projection of the colon and to
electronically subtract stool/fluid as appropriate.

[Series 9: supine colon 1.50 br40 s3 supine thins · axial · 0.74mm/px · z∈[+1383,+1464]mm · 2 of 324 slices shown]
[im 54/324  soft-tissue]
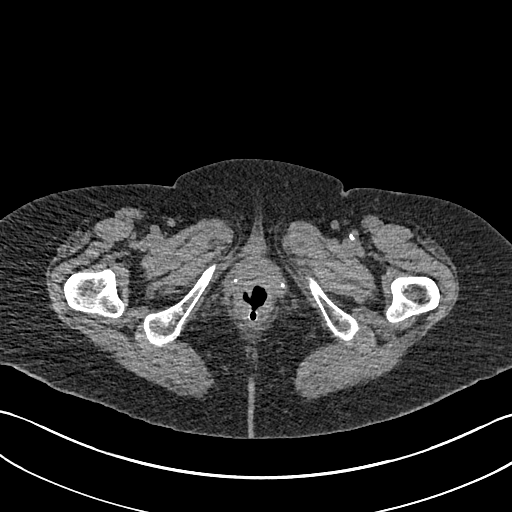
[im 108/324  soft-tissue]
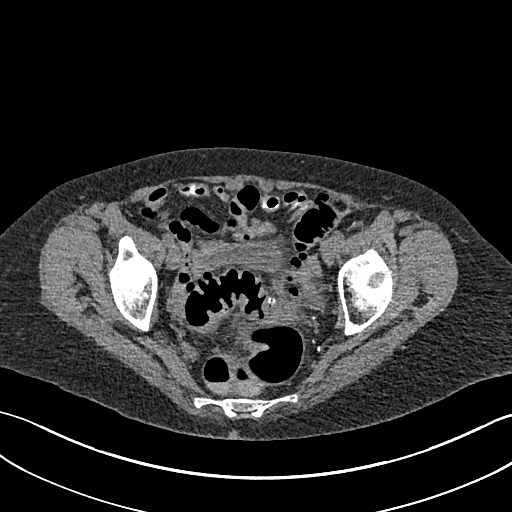

[Series 22: rt decub colon 1.50 br40 s3 rt decub thin · axial · 0.68mm/px · z∈[+1466,+1808]mm · 5 of 343 slices shown, 10 images]
[im 58/343  soft-tissue]
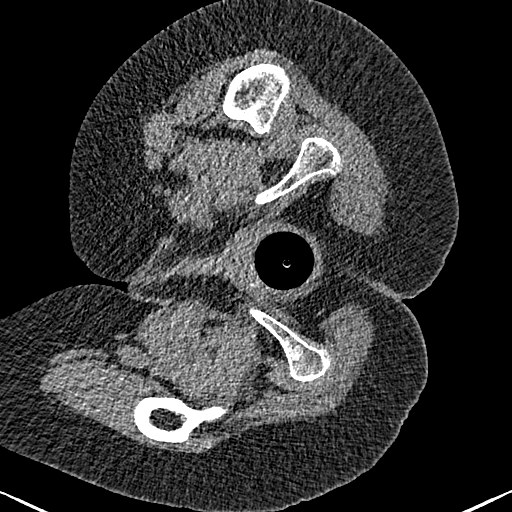
[im 58/343  bone]
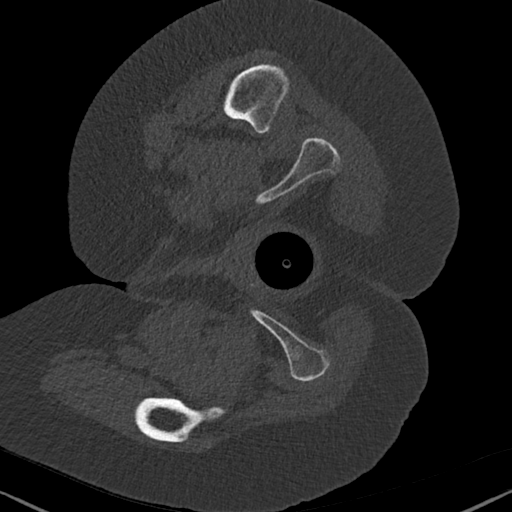
[im 115/343  soft-tissue]
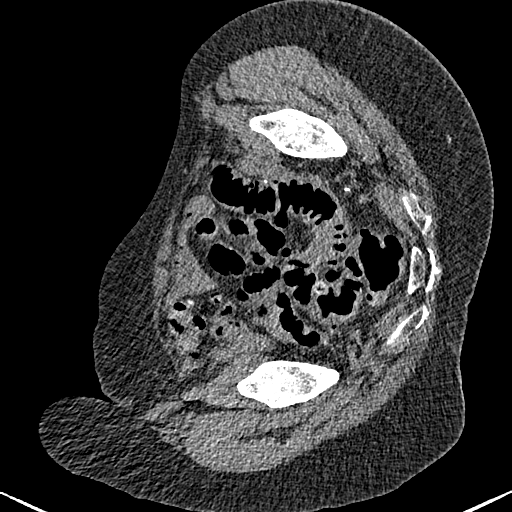
[im 115/343  lung]
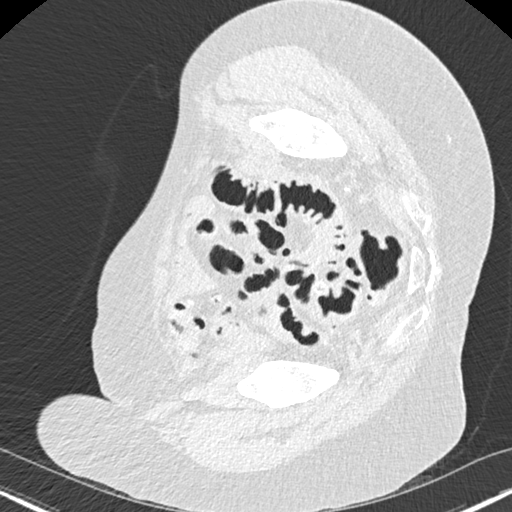
[im 172/343  soft-tissue]
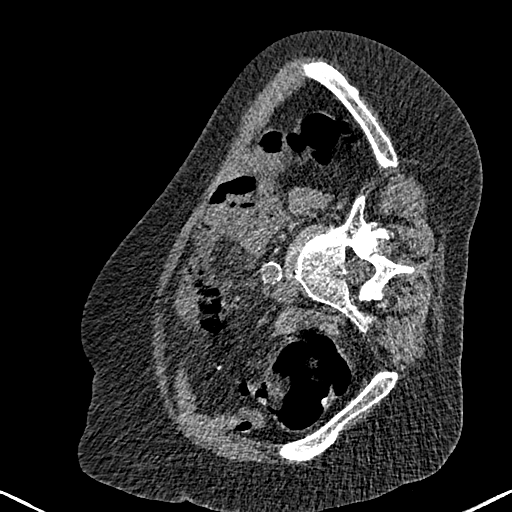
[im 172/343  lung]
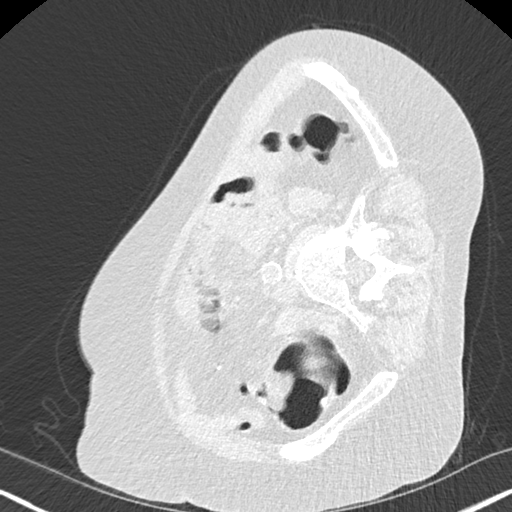
[im 229/343  soft-tissue]
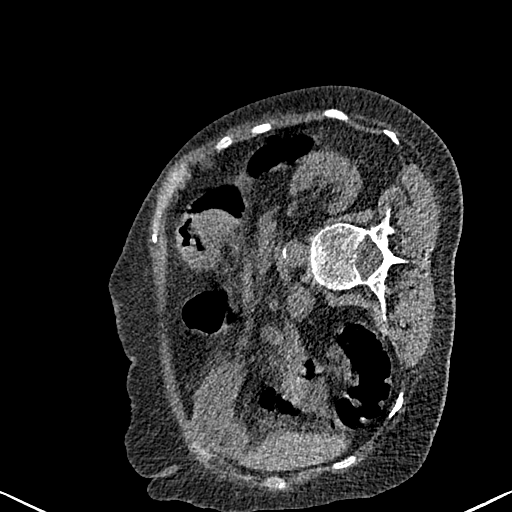
[im 229/343  lung]
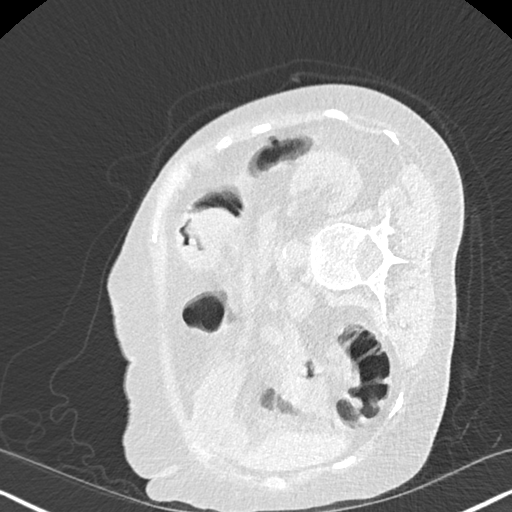
[im 286/343  soft-tissue]
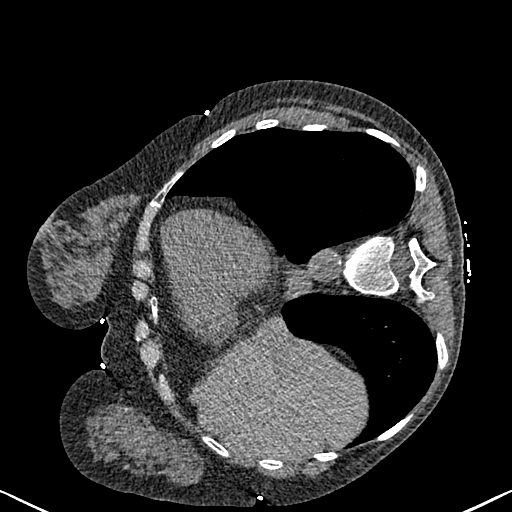
[im 286/343  lung]
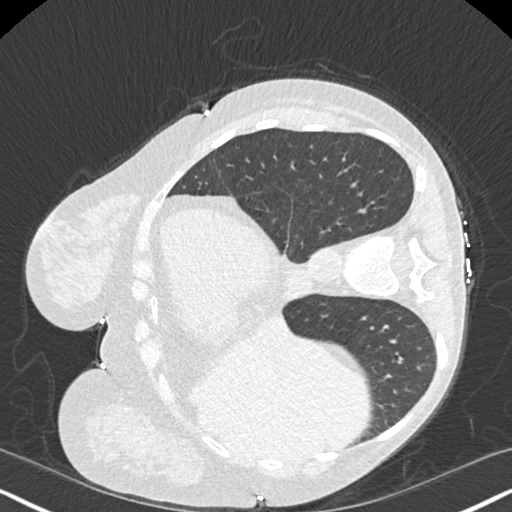

[Series 23: rt decub colon 3.00 br40 s3 rt decub 3mm · axial · 0.78mm/px · z∈[+1551,+1722]mm · 2 of 171 slices shown]
[im 57/171  soft-tissue]
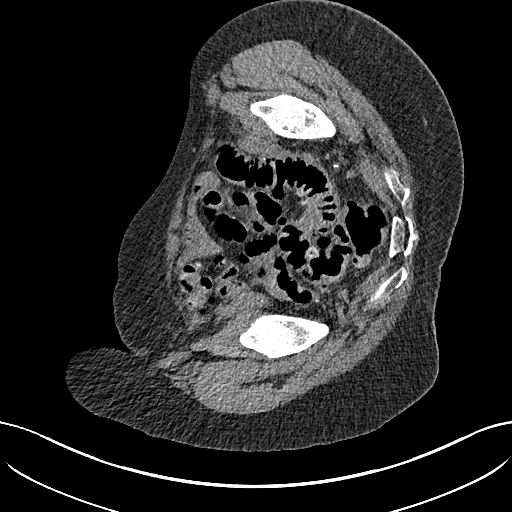
[im 114/171  soft-tissue]
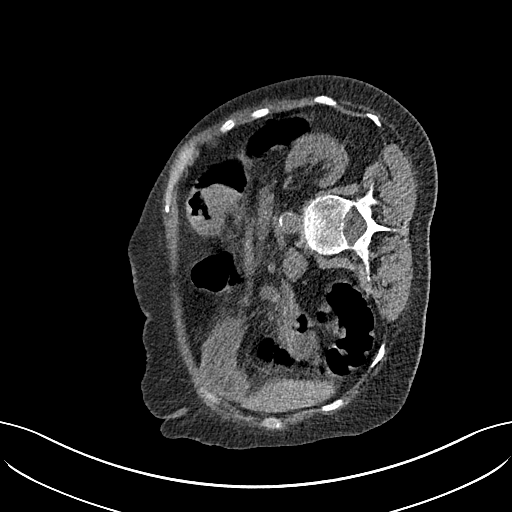

[Series 24: rt decub colon 3.00 br40 s3 cor rt decub · coronal · 0.78mm/px · 2 of 114 slices shown, 3 images]
[im 38/114  soft-tissue]
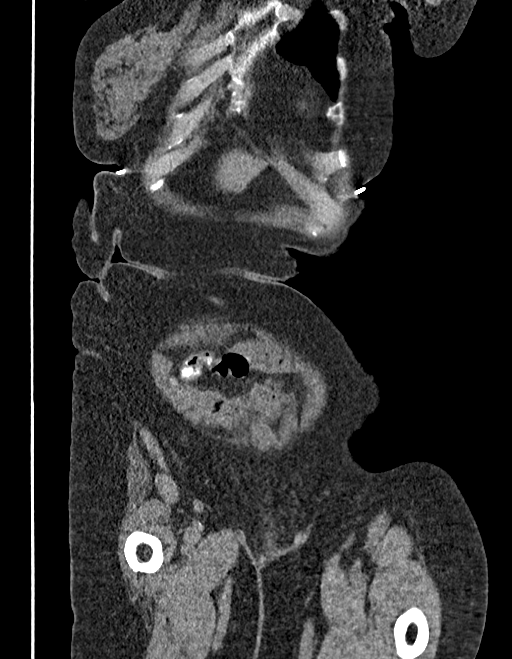
[im 38/114  bone]
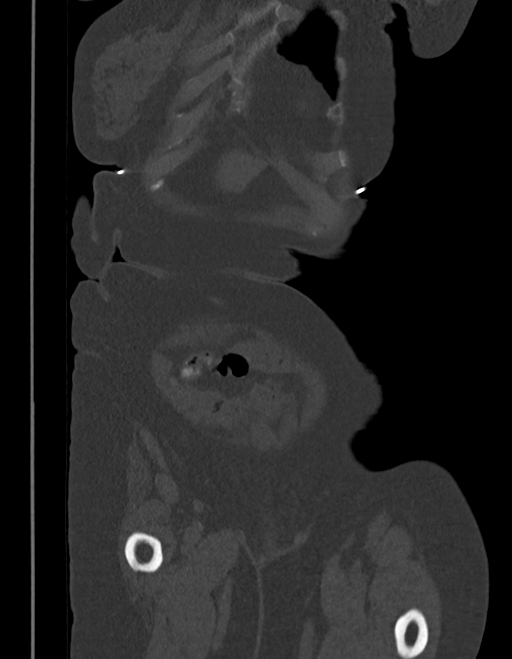
[im 76/114  soft-tissue]
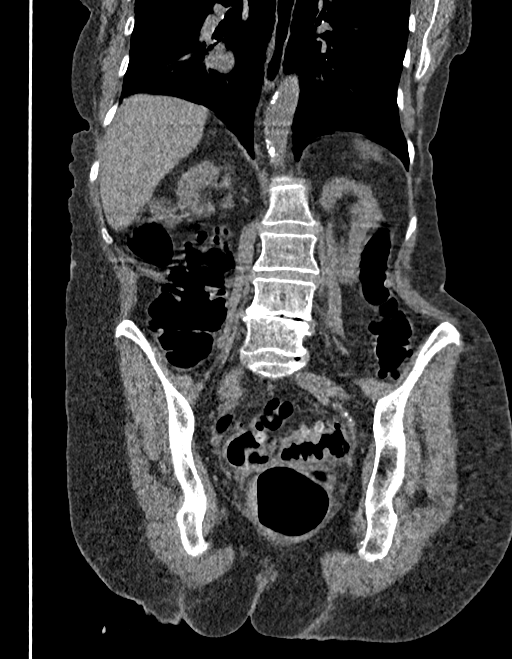

[11 of 46 positions shown; findings below may reference images not displayed]

FINDINGS: VIRTUAL COLONOSCOPY

There is an area of apple-core like irregular narrowing of the lumen
at the splenic flexure extending over approximately a 4 cm segment,
seen best on coronal image 29, series 11 and from axial image 77,
series [DATE] axial image ninety-one of series 9 concerning for
malignancy. Extensive diverticulosis throughout the colon. Area of
under distension noted in the mid sigmoid colon which could be
related to diverticular disease or annular constricting lesion/mass.
No other fixed annual lesions or polypoid filling defects.

Virtual colonoscopy is not designed to detect diminutive polyps
(i.e., less than or equal to 5 mm), the presence or absence of which
may not affect clinical management.

CT ABDOMEN AND PELVIS WITHOUT CONTRAST

Lower chest: No acute findings.

Hepatobiliary: No focal hepatic abnormality. Gallbladder
unremarkable.

Pancreas: No focal abnormality or ductal dilatation.

Spleen: No focal abnormality.  Normal size.

Adrenals/Urinary Tract: No adrenal abnormality. No focal renal
abnormality. No stones or hydronephrosis. Urinary bladder is
unremarkable.

Stomach/Bowel: Stomach and small bowel decompressed, grossly
unremarkable.

Vascular/Lymphatic: Aortoiliac atherosclerosis. No evidence of
aneurysm or adenopathy.

Reproductive: Prior hysterectomy.  No adnexal masses.

Other: No free fluid or free air.

Musculoskeletal: Sclerotic area within the left iliac bone was
present on prior imaging from 5374, likely reflecting bone island.
Degenerative changes in the lumbar spine.
IMPRESSION: Suspicious masslike irregular apple-core lesion at the splenic
flexure over a 4 cm segment concerning for colon cancer.

Area of under distension in the mid sigmoid colon which could be
related to diverticular disease although annular lesion cannot be
excluded. Recommend correlation with colonoscopy.

Diffuse colonic diverticulosis.

Aortic atherosclerosis.

## 2023-08-03 NOTE — Progress Notes (Signed)
 [facility]  [address] 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  She underwent surgical resection. She completed her 6 months of capecitibine by the end of 2023. She has no evidence of disease.   Iron deficiency anemia due to chronic blood loss Iron studies from July, 2024 were normal and her hemoglobin had come up from 10.3 to 11.6. It has now dropped to 10.9 so I will recheck her vitamin levels.   Macrocytic anemia Her anemia was macrocytic. Her hemoglobin was slowly dropping, down to 10.6, then went up to 11.3 but dropped to 10.3 in April,2024. Folate was normal but B12 was quite low at 166 so she was placed on B12 injections monthly. She does tell me her mother had to have B12 injections.  She notes that she feels better on the B-12 injections.  A repeat level in April was 506 despite missing an injection, so we stopped the B12 injections and repeat level was still 445. I will recheck it today.   Plan: She had a colonoscopy done on 07/25/2023 which revealed 2 diminutive colon polyps, pan diverticular disease, internal and external hemorrhoids, and anastomosis at 8 cm. Pathology on 07/27/2023 revealed 2 tubular adenomas with no dysplastic changes. She will have her annual mammogram in August. She has a WBC of 5.3, low hemoglobin of 10.9 down from 11.3, and platelet count of 185,000. I will add a B-12, folate, ferritin, iron and TIBC to her labs today. Her CMP is unremarkable except for a drop in her total protein. Her CEA today is pending and I will call her with the results. I will see her back in 4 months with CBC, CMP, and CEA. The patient understands the plans discussed today and is in agreement with them.  She knows to contact our office if she develops  concerns prior to her next appointment.  I provided 14 minutes of face-to-face time during this encounter and > 50% was spent counseling as documented under my assessment and plan.    Jenna Baumgartner, MD   [facility] [address] [place] [address] Dept: [phone] Dept Fax: [phone]   No orders of the defined types were placed in this encounter.   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)  08/11/2021 Cancer Staging   Staging form: Colon and Rectum, AJCC 8th Edition - Clinical stage from 08/11/2021: Stage IIIC (cT3, cN2b, cM0) - Signed by Jenna Baumgartner, MD on 09/14/2021 Histopathologic type: Adenocarcinoma, NOS Stage prefix: Initial diagnosis Total positive nodes: 12 Total nodes examined: 32 Histologic grade (G): G3 Histologic grading system: 4 grade system Laterality: Left Tumor size (mm): 63 Lymph-vascular invasion (LVI): LVI present/identified, NOS Diagnostic confirmation: Positive histology Specimen type: Excision Staged by: Managing physician Tumor deposits (TD): Absent Carcinoembryonic antigen (CEA) (ng/mL): 3.6 Perineural invasion (PNI): Unknown Microsatellite instability (MSI): Stable KRAS mutation: Not assessed NRAS mutation: Not assessed BRAF mutation: Not assessed Stage used in treatment planning: Yes National guidelines used in treatment planning: Yes Type of national guideline used in treatment planning: NCCN Staging comments: Over 80 years  old, will use capecitabine  at reduced dose as tolerated   08/25/2021 Initial Diagnosis   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 Patient states that she feels well but complains of worsening balance and has started physical therapy recently. She had a colonoscopy done on 07/25/2023 which revealed 2 diminutive colon polyps, pan diverticular disease, internal and external hemorrhoids, and anastomosis at 8 cm. Pathology on 07/27/2023 revealed 2 tubular adenomas with no dysplastic changes. She will have her annual mammogram in August. She has a WBC of 5.3, low hemoglobin of 10.9 down from 11.3, and platelet count of 185,000. I will add a B-12, folate, ferritin, iron and TIBC to her labs today. Her CMP is unremarkable except for low total protein, and her renal function is improved.  Her CEA today is pending and I will call her with the results. I will see her back in 4 months with CBC, CMP, and CEA.  She denies fever, chills, night sweats, or other signs of infection. She denies cardiorespiratory and gastrointestinal issues. She  denies pain. Her appetite is good and her weight has increased 4 pounds over last 3.5 months.     REVIEW OF SYSTEMS:  Review of Systems  Constitutional: Negative.  Negative for appetite change, chills, diaphoresis, fatigue, fever and unexpected weight change.  HENT:  Negative.  Negative for hearing loss, lump/mass, mouth sores, nosebleeds, sore throat, tinnitus, trouble swallowing and voice change.   Eyes: Negative.  Negative for eye problems and icterus.  Respiratory: Negative.  Negative for chest tightness, cough, hemoptysis, shortness of breath and wheezing.   Cardiovascular:  Positive for leg swelling (feet). Negative for chest pain and palpitations.  Gastrointestinal: Negative.  Negative for abdominal distention, abdominal pain, blood in stool, constipation, diarrhea, nausea, rectal pain and vomiting.  Endocrine: Negative.  Negative for hot flashes.  Genitourinary: Negative.  Negative for  bladder incontinence, difficulty urinating, dyspareunia, dysuria, frequency, hematuria, menstrual problem, nocturia, pelvic pain, vaginal bleeding and vaginal discharge.   Musculoskeletal:  Positive for gait problem (imbalance). Negative for arthralgias, back pain, flank pain, myalgias, neck pain and neck stiffness.  Skin: Negative.  Negative for itching, rash and wound.  Neurological:  Positive for gait problem (imbalance). Negative for dizziness, extremity weakness, headaches, light-headedness, numbness, seizures and speech difficulty.  Hematological: Negative.  Negative for adenopathy. Does not bruise/bleed easily.  Psychiatric/Behavioral: Negative.  Negative for confusion, decreased concentration, depression, sleep disturbance and suicidal ideas. The patient is not nervous/anxious.   All other systems reviewed and are negative.    VITALS:  Blood pressure 132/67, pulse (!) 53, temperature 97.6 F (36.4 C), temperature source Oral, resp. rate 16, height 5' 1.7" (1.567 m), weight 123 lb 4.8 oz (55.9 kg), SpO2 100%.  Wt Readings from Last 3 Encounters:  08/07/23 123 lb 4.8 oz (55.9 kg)  04/25/23 119 lb 11.2 oz (54.3 kg)  01/19/23 122 lb 11.2 oz (55.7 kg)    Body mass index is 22.77 kg/m.  Performance status (ECOG): 1 - Symptomatic but completely ambulatory  PHYSICAL EXAM:  Physical Exam Vitals and nursing note reviewed.  Constitutional:      General: She is not in acute distress.    Appearance: Normal appearance. She is normal weight. She is not ill-appearing, toxic-appearing or diaphoretic.  HENT:     Head: Normocephalic and atraumatic.     Right Ear: Tympanic membrane, ear canal and external ear normal. There is no impacted cerumen.     Left Ear: Tympanic membrane, ear canal and external ear normal. There is no impacted cerumen.     Nose: Nose normal. No congestion or rhinorrhea.     Mouth/Throat:     Mouth: Mucous membranes are moist.     Pharynx: Oropharynx is clear. No  oropharyngeal exudate or posterior oropharyngeal erythema.  Eyes:     General: No scleral icterus.       Right eye: No discharge.        Left eye: No discharge.     Extraocular Movements: Extraocular movements intact.     Conjunctiva/sclera: Conjunctivae normal.     Pupils: Pupils are equal, round, and reactive to light.  Neck:     Vascular: No carotid bruit.  Cardiovascular:     Rate and Rhythm: Regular rhythm. Bradycardia present.     Pulses: Normal pulses.     Heart sounds: Normal heart sounds. No murmur heard.    No friction rub. No gallop.  Pulmonary:     Effort: Pulmonary effort is normal. No respiratory distress.     Breath sounds: Normal breath sounds. No stridor. No wheezing, rhonchi or rales.  Chest:     Chest wall: No tenderness.  Abdominal:     General: Bowel sounds are normal. There is no distension.     Palpations: Abdomen is soft. There is no hepatomegaly, splenomegaly or mass.     Tenderness: There is no abdominal tenderness. There is no right CVA tenderness, left CVA tenderness, guarding or rebound.     Hernia: No hernia is present.  Musculoskeletal:        General: No swelling, tenderness, deformity or signs of injury. Normal range of motion.     Cervical back: Normal range of motion and neck supple. No rigidity or tenderness.     Right lower leg: No edema.     Left lower leg: No edema.     Right foot: Swelling present.     Left foot: Swelling present.  Lymphadenopathy:     Cervical: No cervical adenopathy.     Upper Body:     Right upper body: No supraclavicular or axillary adenopathy.     Left upper body: No supraclavicular or axillary adenopathy.     Lower Body: No right inguinal adenopathy. No left inguinal adenopathy.  Skin:    General: Skin is warm and dry.     Coloration: Skin is not jaundiced or pale.     Findings: No bruising, ecchymosis, erythema, lesion or rash.  Neurological:     General: No focal deficit present.     Mental Status: She is  alert and oriented to person, place, and time. Mental status is at baseline.     Cranial Nerves: No cranial nerve deficit.     Sensory: No sensory deficit.     Motor: No weakness.     Coordination: Coordination normal.     Gait: Gait normal.     Deep Tendon Reflexes: Reflexes normal.  Psychiatric:        Mood and Affect: Mood normal.        Behavior: Behavior normal.        Thought Content: Thought content normal.        Judgment: Judgment normal.    LABS:      Latest Ref Rng &  Units 08/07/2023    1:19 PM 04/25/2023   12:47 PM 01/19/2023    2:17 PM  CBC  WBC 4.0 - 10.5 K/uL 5.3  5.5  5.7   Hemoglobin 12.0 - 15.0 g/dL 09.8  11.9  14.7   Hematocrit 36.0 - 46.0 % 32.9  33.9  36.1   Platelets 150 - 400 K/uL 185  229  180       Latest Ref Rng & Units 08/07/2023    1:19 PM 04/25/2023   12:47 PM 01/19/2023    2:17 PM  CMP  Glucose 70 - 99 mg/dL 829  86  73   BUN 8 - 23 mg/dL 22  26  27    Creatinine 0.44 - 1.00 mg/dL 5.62  1.30  8.65   Sodium 135 - 145 mmol/L 143  142  141   Potassium 3.5 - 5.1 mmol/L 4.2  4.1  3.7   Chloride 98 - 111 mmol/L 110  105  108   CO2 22 - 32 mmol/L 23  25  25    Calcium 8.9 - 10.3 mg/dL 9.3  9.4  9.1   Total Protein 6.5 - 8.1 g/dL 5.9  6.5  6.2   Total Bilirubin 0.0 - 1.2 mg/dL 0.5  0.5  0.7   Alkaline Phos 38 - 126 U/L 64  69  49   AST 15 - 41 U/L 51  54  40   ALT 0 - 44 U/L 30  34  34    Lab Results  Component Value Date   CEA1 1.7 01/19/2023   CEA <1.00 08/07/2023   /  CEA  Date Value Ref Range Status  01/19/2023 1.7 0.0 - 4.7 ng/mL Final    Comment:    (NOTE)                             Nonsmokers          <3.9                             Smokers             <5.6 [employer] Electrochemiluminescence Immunoassay (ECLIA) Values obtained with different assay methods or kits cannot be used interchangeably.  Results cannot be interpreted as absolute evidence of the presence or absence of malignant disease. Performed At: [facility] [address] [place], [place] [id number] Pearlean Botts MD MW:[id number]    CEA [facility])  Date Value Ref Range Status  08/07/2023 <1.00 0.00 - 5.00 ng/mL Final    Comment:    (NOTE) This test was performed using [employer] Coulter's paramagnetic chemiluminescent immunoassay. Values obtained from different assay methods cannot be used interchangeably. Please note that up to 8% of patients who smoke may see values 5.1-10.0 ng/ml and 1% of patients who smoke may see CEA levels >10.0 ng/ml. Performed at [facility], [address] [place], [place] [address]    No results found for: "PSA1" No results found for: "[id number]" No results found for: "CAN125"  No results found for: "TOTALPROTELP", "ALBUMINELP", "A1GS", "A2GS", "BETS", "BETA2SER", "GAMS", "MSPIKE", "SPEI" Lab Results  Component Value Date   TIBC 276 08/07/2023   TIBC 322 04/25/2023   TIBC 311 10/17/2022   FERRITIN 49 08/07/2023   FERRITIN 82 04/25/2023   FERRITIN 53 10/17/2022   IRONPCTSAT 19 08/07/2023   IRONPCTSAT 29 04/25/2023   IRONPCTSAT 20 10/17/2022  Lab Results  Component Value Date   VITAMINB12 262 08/07/2023   No results found for: "LDH"  STUDIES:  Exam: 07/25/2023 Colonoscopy with Polypectomy Impression: Diminutive colon polyps (2) Pan diverticular disease Internal external hemorrhoids Anastomosis at 8cm, unremarkable  Pathology: 07/27/2023 Diagnosis: Colon, polyps, ascending Tubular adenoma, fragmented No high grade dysplasia or malignancy identified Colon, polyps, transverse Tubular adenoma No high grade dysplasia or malignancy identified    HISTORY:   Past Medical History:  Diagnosis Date   Arthritis    B12 deficiency anemia 01/26/2022   HTN (hypertension)    Hypothyroidism    Osteoporosis     Past Surgical History:  Procedure Laterality Date   BLADDER SURGERY     COLON SURGERY     eyelid surgery      Family History  Problem Relation Age of  Onset   Colon cancer Mother    Prostate cancer Brother     Social History:  reports that she has never smoked. She has never used smokeless tobacco. She reports that she does not drink alcohol and does not use drugs.The patient is alone today.  Allergies: No Known Allergies  Current Medications: Current Outpatient Medications  Medication Sig Dispense Refill   aspirin 81 MG chewable tablet Chew by mouth.     carvedilol (COREG) 3.125 MG tablet Take 3.125 mg by mouth 2 (two) times daily.     Cholecalciferol 25 MCG (1000 UT) capsule Take by mouth.     fluticasone (FLONASE) 50 MCG/ACT nasal spray Place 2 sprays into both nostrils daily.     furosemide (LASIX) 20 MG tablet Take 20 mg by mouth daily.     hydroxychloroquine (PLAQUENIL) 200 MG tablet Take 200 mg by mouth 2 (two) times daily.     nitroGLYCERIN (NITROSTAT) 0.4 MG SL tablet Place under the tongue.     predniSONE (DELTASONE) 10 MG tablet Take 10 mg by mouth daily as needed.     PROLIA  60 MG/ML SOSY injection Inject into the skin.     rosuvastatin (CRESTOR) 20 MG tablet Take 20 mg by mouth at bedtime.     SYNTHROID 112 MCG tablet Take 112 mcg by mouth daily.     No current facility-administered medications for this visit.    I,Jasmine M Krisko,acting as a scribe for Jenna Baumgartner, MD.,have documented all relevant documentation on the behalf of Jenna Baumgartner, MD,as directed by  Jenna Baumgartner, MD while in the presence of Jenna Baumgartner, MD.

## 2023-08-06 DIAGNOSIS — R2689 Other abnormalities of gait and mobility: Secondary | ICD-10-CM | POA: Diagnosis not present

## 2023-08-06 DIAGNOSIS — M6281 Muscle weakness (generalized): Secondary | ICD-10-CM | POA: Diagnosis not present

## 2023-08-06 DIAGNOSIS — R2681 Unsteadiness on feet: Secondary | ICD-10-CM | POA: Diagnosis not present

## 2023-08-07 ENCOUNTER — Inpatient Hospital Stay: Attending: Oncology

## 2023-08-07 ENCOUNTER — Other Ambulatory Visit: Payer: Self-pay | Admitting: Oncology

## 2023-08-07 ENCOUNTER — Inpatient Hospital Stay (HOSPITAL_BASED_OUTPATIENT_CLINIC_OR_DEPARTMENT_OTHER): Admitting: Oncology

## 2023-08-07 ENCOUNTER — Encounter: Payer: Self-pay | Admitting: Oncology

## 2023-08-07 ENCOUNTER — Telehealth: Payer: Self-pay | Admitting: Oncology

## 2023-08-07 ENCOUNTER — Other Ambulatory Visit: Payer: Self-pay

## 2023-08-07 VITALS — BP 132/67 | HR 53 | Temp 97.6°F | Resp 16 | Ht 61.7 in | Wt 123.3 lb

## 2023-08-07 DIAGNOSIS — D539 Nutritional anemia, unspecified: Secondary | ICD-10-CM | POA: Insufficient documentation

## 2023-08-07 DIAGNOSIS — Z85038 Personal history of other malignant neoplasm of large intestine: Secondary | ICD-10-CM | POA: Insufficient documentation

## 2023-08-07 DIAGNOSIS — C186 Malignant neoplasm of descending colon: Secondary | ICD-10-CM

## 2023-08-07 DIAGNOSIS — C772 Secondary and unspecified malignant neoplasm of intra-abdominal lymph nodes: Secondary | ICD-10-CM | POA: Diagnosis not present

## 2023-08-07 DIAGNOSIS — D510 Vitamin B12 deficiency anemia due to intrinsic factor deficiency: Secondary | ICD-10-CM

## 2023-08-07 DIAGNOSIS — Z8042 Family history of malignant neoplasm of prostate: Secondary | ICD-10-CM | POA: Diagnosis not present

## 2023-08-07 DIAGNOSIS — D500 Iron deficiency anemia secondary to blood loss (chronic): Secondary | ICD-10-CM

## 2023-08-07 DIAGNOSIS — Z800 Family history of malignant neoplasm of digestive organs: Secondary | ICD-10-CM | POA: Diagnosis not present

## 2023-08-07 LAB — CMP (CANCER CENTER ONLY)
ALT: 30 U/L (ref 0–44)
AST: 51 U/L — ABNORMAL HIGH (ref 15–41)
Albumin: 3.6 g/dL (ref 3.5–5.0)
Alkaline Phosphatase: 64 U/L (ref 38–126)
Anion gap: 10 (ref 5–15)
BUN: 22 mg/dL (ref 8–23)
CO2: 23 mmol/L (ref 22–32)
Calcium: 9.3 mg/dL (ref 8.9–10.3)
Chloride: 110 mmol/L (ref 98–111)
Creatinine: 1.17 mg/dL — ABNORMAL HIGH (ref 0.44–1.00)
GFR, Estimated: 46 mL/min — ABNORMAL LOW (ref >60)
Glucose, Bld: 109 mg/dL — ABNORMAL HIGH (ref 70–99)
Potassium: 4.2 mmol/L (ref 3.5–5.1)
Sodium: 143 mmol/L (ref 135–145)
Total Bilirubin: 0.5 mg/dL (ref 0.0–1.2)
Total Protein: 5.9 g/dL — ABNORMAL LOW (ref 6.5–8.1)

## 2023-08-07 LAB — CBC WITH DIFFERENTIAL (CANCER CENTER ONLY)
Abs Immature Granulocytes: 0.02 10*3/uL (ref 0.00–0.07)
Basophils Absolute: 0.1 10*3/uL (ref 0.0–0.1)
Basophils Relative: 1 %
Eosinophils Absolute: 0 10*3/uL (ref 0.0–0.5)
Eosinophils Relative: 1 %
HCT: 32.9 % — ABNORMAL LOW (ref 36.0–46.0)
Hemoglobin: 10.9 g/dL — ABNORMAL LOW (ref 12.0–15.0)
Immature Granulocytes: 0 %
Lymphocytes Relative: 25 %
Lymphs Abs: 1.3 10*3/uL (ref 0.7–4.0)
MCH: 30.4 pg (ref 26.0–34.0)
MCHC: 33.1 g/dL (ref 30.0–36.0)
MCV: 91.9 fL (ref 80.0–100.0)
Monocytes Absolute: 0.6 10*3/uL (ref 0.1–1.0)
Monocytes Relative: 12 %
Neutro Abs: 3.3 10*3/uL (ref 1.7–7.7)
Neutrophils Relative %: 61 %
Platelet Count: 185 10*3/uL (ref 150–400)
RBC: 3.58 MIL/uL — ABNORMAL LOW (ref 3.87–5.11)
RDW: 13.1 % (ref 11.5–15.5)
WBC Count: 5.3 10*3/uL (ref 4.0–10.5)
nRBC: 0 % (ref 0.0–0.2)
nRBC: 0 /100{WBCs}

## 2023-08-07 LAB — IRON AND TIBC
Iron: 53 ug/dL (ref 28–170)
Saturation Ratios: 19 % (ref 10.4–31.8)
TIBC: 276 ug/dL (ref 250–450)
UIBC: 223 ug/dL

## 2023-08-07 LAB — CEA (ACCESS): CEA (CHCC): <1 ng/mL (ref 0.00–5.00)

## 2023-08-07 LAB — FERRITIN: Ferritin: 49 ng/mL (ref 11–307)

## 2023-08-07 LAB — VITAMIN B12: Vitamin B-12: 262 pg/mL (ref 180–914)

## 2023-08-07 LAB — FOLATE: Folate: 13.9 ng/mL (ref >5.9)

## 2023-08-07 NOTE — Telephone Encounter (Signed)
 Patient has been scheduled for follow-up visit per 08/07/23 LOS.  Pt given an appt calendar with date and time.

## 2023-08-17 ENCOUNTER — Telehealth: Payer: Self-pay

## 2023-08-17 NOTE — Telephone Encounter (Signed)
   Signed        ----- Message from [name] sent at 08/17/2023  7:31 AM EDT ----- Regarding: call Tell her vitamin levels are normal but B12 is drifting down.  I rec she take an oral supplement daily.  Also her protein has dropped, make sure she is getting protein in her diet -meat fish, chicken, eggs, nuts, etc

## 2023-08-17 NOTE — Telephone Encounter (Signed)
 Attempted to contact patient. No answer.

## 2023-08-17 NOTE — Telephone Encounter (Signed)
-----   Message from [name] sent at 08/17/2023  7:31 AM EDT ----- Regarding: call Tell her vitamin levels are normal but B12 is drifting down.  I rec she take an oral supplement daily.  Also her protein has dropped, make sure she is getting protein in her diet -meat fish, chicken, eggs, nuts, etc.

## 2023-08-31 DIAGNOSIS — M810 Age-related osteoporosis without current pathological fracture: Secondary | ICD-10-CM | POA: Diagnosis not present

## 2023-09-28 DIAGNOSIS — R600 Localized edema: Secondary | ICD-10-CM | POA: Diagnosis not present

## 2023-09-28 DIAGNOSIS — E785 Hyperlipidemia, unspecified: Secondary | ICD-10-CM | POA: Diagnosis not present

## 2023-09-28 DIAGNOSIS — E559 Vitamin D deficiency, unspecified: Secondary | ICD-10-CM | POA: Diagnosis not present

## 2023-09-28 DIAGNOSIS — M810 Age-related osteoporosis without current pathological fracture: Secondary | ICD-10-CM | POA: Diagnosis not present

## 2023-09-28 DIAGNOSIS — I10 Essential (primary) hypertension: Secondary | ICD-10-CM | POA: Diagnosis not present

## 2023-09-28 DIAGNOSIS — D539 Nutritional anemia, unspecified: Secondary | ICD-10-CM | POA: Diagnosis not present

## 2023-09-28 DIAGNOSIS — C186 Malignant neoplasm of descending colon: Secondary | ICD-10-CM | POA: Diagnosis not present

## 2023-09-28 DIAGNOSIS — C772 Secondary and unspecified malignant neoplasm of intra-abdominal lymph nodes: Secondary | ICD-10-CM | POA: Diagnosis not present

## 2023-09-28 DIAGNOSIS — M359 Systemic involvement of connective tissue, unspecified: Secondary | ICD-10-CM | POA: Diagnosis not present

## 2023-09-28 DIAGNOSIS — N1831 Chronic kidney disease, stage 3a: Secondary | ICD-10-CM | POA: Diagnosis not present

## 2023-09-28 DIAGNOSIS — E039 Hypothyroidism, unspecified: Secondary | ICD-10-CM | POA: Diagnosis not present

## 2023-09-28 DIAGNOSIS — I2510 Atherosclerotic heart disease of native coronary artery without angina pectoris: Secondary | ICD-10-CM | POA: Diagnosis not present

## 2023-10-08 DIAGNOSIS — Z7982 Long term (current) use of aspirin: Secondary | ICD-10-CM | POA: Diagnosis not present

## 2023-10-08 DIAGNOSIS — I959 Hypotension, unspecified: Secondary | ICD-10-CM | POA: Diagnosis not present

## 2023-10-08 DIAGNOSIS — R42 Dizziness and giddiness: Secondary | ICD-10-CM | POA: Diagnosis not present

## 2023-10-08 DIAGNOSIS — R110 Nausea: Secondary | ICD-10-CM | POA: Diagnosis not present

## 2023-10-08 DIAGNOSIS — D649 Anemia, unspecified: Secondary | ICD-10-CM | POA: Diagnosis not present

## 2023-10-08 DIAGNOSIS — E7800 Pure hypercholesterolemia, unspecified: Secondary | ICD-10-CM | POA: Diagnosis not present

## 2023-10-08 DIAGNOSIS — R001 Bradycardia, unspecified: Secondary | ICD-10-CM | POA: Diagnosis not present

## 2023-10-08 DIAGNOSIS — I214 Non-ST elevation (NSTEMI) myocardial infarction: Secondary | ICD-10-CM | POA: Diagnosis not present

## 2023-10-08 DIAGNOSIS — Z79899 Other long term (current) drug therapy: Secondary | ICD-10-CM | POA: Diagnosis not present

## 2023-10-08 DIAGNOSIS — I2119 ST elevation (STEMI) myocardial infarction involving other coronary artery of inferior wall: Secondary | ICD-10-CM | POA: Diagnosis not present

## 2023-10-08 DIAGNOSIS — I4510 Unspecified right bundle-branch block: Secondary | ICD-10-CM | POA: Diagnosis not present

## 2023-10-08 DIAGNOSIS — I10 Essential (primary) hypertension: Secondary | ICD-10-CM | POA: Diagnosis not present

## 2023-10-08 DIAGNOSIS — Z955 Presence of coronary angioplasty implant and graft: Secondary | ICD-10-CM | POA: Diagnosis not present

## 2023-10-08 DIAGNOSIS — E039 Hypothyroidism, unspecified: Secondary | ICD-10-CM | POA: Diagnosis not present

## 2023-10-08 DIAGNOSIS — Z20822 Contact with and (suspected) exposure to covid-19: Secondary | ICD-10-CM | POA: Diagnosis not present

## 2023-10-08 DIAGNOSIS — I252 Old myocardial infarction: Secondary | ICD-10-CM | POA: Diagnosis not present

## 2023-10-08 DIAGNOSIS — I4719 Other supraventricular tachycardia: Secondary | ICD-10-CM | POA: Diagnosis not present

## 2023-10-08 DIAGNOSIS — R61 Generalized hyperhidrosis: Secondary | ICD-10-CM | POA: Diagnosis not present

## 2023-10-08 DIAGNOSIS — D72829 Elevated white blood cell count, unspecified: Secondary | ICD-10-CM | POA: Diagnosis not present

## 2023-10-08 DIAGNOSIS — I358 Other nonrheumatic aortic valve disorders: Secondary | ICD-10-CM | POA: Diagnosis not present

## 2023-10-08 DIAGNOSIS — I2510 Atherosclerotic heart disease of native coronary artery without angina pectoris: Secondary | ICD-10-CM | POA: Diagnosis not present

## 2023-10-08 DIAGNOSIS — T82855A Stenosis of coronary artery stent, initial encounter: Secondary | ICD-10-CM | POA: Diagnosis not present

## 2023-10-08 DIAGNOSIS — R079 Chest pain, unspecified: Secondary | ICD-10-CM | POA: Diagnosis not present

## 2023-10-08 DIAGNOSIS — I517 Cardiomegaly: Secondary | ICD-10-CM | POA: Diagnosis not present

## 2023-10-08 DIAGNOSIS — M069 Rheumatoid arthritis, unspecified: Secondary | ICD-10-CM | POA: Diagnosis not present

## 2023-11-30 DIAGNOSIS — I130 Hypertensive heart and chronic kidney disease with heart failure and stage 1 through stage 4 chronic kidney disease, or unspecified chronic kidney disease: Secondary | ICD-10-CM | POA: Diagnosis not present

## 2023-11-30 DIAGNOSIS — Z955 Presence of coronary angioplasty implant and graft: Secondary | ICD-10-CM | POA: Diagnosis not present

## 2023-11-30 DIAGNOSIS — Z7982 Long term (current) use of aspirin: Secondary | ICD-10-CM | POA: Diagnosis not present

## 2023-11-30 DIAGNOSIS — R110 Nausea: Secondary | ICD-10-CM | POA: Diagnosis not present

## 2023-11-30 DIAGNOSIS — I519 Heart disease, unspecified: Secondary | ICD-10-CM | POA: Diagnosis not present

## 2023-11-30 DIAGNOSIS — R0789 Other chest pain: Secondary | ICD-10-CM | POA: Diagnosis not present

## 2023-11-30 DIAGNOSIS — I498 Other specified cardiac arrhythmias: Secondary | ICD-10-CM | POA: Diagnosis not present

## 2023-11-30 DIAGNOSIS — I2510 Atherosclerotic heart disease of native coronary artery without angina pectoris: Secondary | ICD-10-CM | POA: Diagnosis not present

## 2023-11-30 DIAGNOSIS — R079 Chest pain, unspecified: Secondary | ICD-10-CM | POA: Diagnosis not present

## 2023-11-30 DIAGNOSIS — I252 Old myocardial infarction: Secondary | ICD-10-CM | POA: Diagnosis not present

## 2023-11-30 DIAGNOSIS — Z79890 Hormone replacement therapy (postmenopausal): Secondary | ICD-10-CM | POA: Diagnosis not present

## 2023-11-30 DIAGNOSIS — I340 Nonrheumatic mitral (valve) insufficiency: Secondary | ICD-10-CM | POA: Diagnosis not present

## 2023-11-30 DIAGNOSIS — I4510 Unspecified right bundle-branch block: Secondary | ICD-10-CM | POA: Diagnosis not present

## 2023-11-30 DIAGNOSIS — N183 Chronic kidney disease, stage 3 unspecified: Secondary | ICD-10-CM | POA: Diagnosis not present

## 2023-11-30 DIAGNOSIS — I452 Bifascicular block: Secondary | ICD-10-CM | POA: Diagnosis not present

## 2023-11-30 DIAGNOSIS — I213 ST elevation (STEMI) myocardial infarction of unspecified site: Secondary | ICD-10-CM | POA: Diagnosis not present

## 2023-11-30 DIAGNOSIS — E785 Hyperlipidemia, unspecified: Secondary | ICD-10-CM | POA: Diagnosis not present

## 2023-11-30 DIAGNOSIS — I878 Other specified disorders of veins: Secondary | ICD-10-CM | POA: Diagnosis not present

## 2023-11-30 DIAGNOSIS — I249 Acute ischemic heart disease, unspecified: Secondary | ICD-10-CM | POA: Diagnosis not present

## 2023-11-30 DIAGNOSIS — I25118 Atherosclerotic heart disease of native coronary artery with other forms of angina pectoris: Secondary | ICD-10-CM | POA: Diagnosis not present

## 2023-11-30 DIAGNOSIS — T82855A Stenosis of coronary artery stent, initial encounter: Secondary | ICD-10-CM | POA: Diagnosis not present

## 2023-11-30 DIAGNOSIS — E039 Hypothyroidism, unspecified: Secondary | ICD-10-CM | POA: Diagnosis not present

## 2023-11-30 DIAGNOSIS — Z7902 Long term (current) use of antithrombotics/antiplatelets: Secondary | ICD-10-CM | POA: Diagnosis not present

## 2023-11-30 DIAGNOSIS — N179 Acute kidney failure, unspecified: Secondary | ICD-10-CM | POA: Diagnosis not present

## 2023-11-30 DIAGNOSIS — I214 Non-ST elevation (NSTEMI) myocardial infarction: Secondary | ICD-10-CM | POA: Diagnosis not present

## 2023-11-30 DIAGNOSIS — Z66 Do not resuscitate: Secondary | ICD-10-CM | POA: Diagnosis not present

## 2023-11-30 DIAGNOSIS — I5189 Other ill-defined heart diseases: Secondary | ICD-10-CM | POA: Diagnosis not present

## 2023-11-30 DIAGNOSIS — R1012 Left upper quadrant pain: Secondary | ICD-10-CM | POA: Diagnosis not present

## 2023-12-12 DIAGNOSIS — I252 Old myocardial infarction: Secondary | ICD-10-CM | POA: Diagnosis not present

## 2023-12-12 DIAGNOSIS — I25118 Atherosclerotic heart disease of native coronary artery with other forms of angina pectoris: Secondary | ICD-10-CM | POA: Diagnosis not present

## 2023-12-17 DIAGNOSIS — N10 Acute tubulo-interstitial nephritis: Secondary | ICD-10-CM | POA: Diagnosis not present

## 2023-12-17 DIAGNOSIS — R944 Abnormal results of kidney function studies: Secondary | ICD-10-CM | POA: Diagnosis not present

## 2023-12-17 DIAGNOSIS — Z87440 Personal history of urinary (tract) infections: Secondary | ICD-10-CM | POA: Diagnosis not present

## 2023-12-17 DIAGNOSIS — N12 Tubulo-interstitial nephritis, not specified as acute or chronic: Secondary | ICD-10-CM | POA: Diagnosis not present

## 2023-12-17 DIAGNOSIS — R531 Weakness: Secondary | ICD-10-CM | POA: Diagnosis not present

## 2023-12-17 DIAGNOSIS — N1330 Unspecified hydronephrosis: Secondary | ICD-10-CM | POA: Diagnosis not present

## 2023-12-19 ENCOUNTER — Inpatient Hospital Stay

## 2023-12-19 ENCOUNTER — Inpatient Hospital Stay: Attending: Oncology | Admitting: Oncology

## 2024-01-02 ENCOUNTER — Inpatient Hospital Stay: Attending: Oncology | Admitting: Hematology and Oncology

## 2024-01-02 ENCOUNTER — Inpatient Hospital Stay

## 2024-01-02 ENCOUNTER — Encounter: Payer: Self-pay | Admitting: Hematology and Oncology

## 2024-01-02 VITALS — BP 125/61 | HR 74 | Temp 97.6°F | Resp 18 | Ht 61.7 in | Wt 112.5 lb

## 2024-01-02 DIAGNOSIS — C186 Malignant neoplasm of descending colon: Secondary | ICD-10-CM

## 2024-01-02 DIAGNOSIS — Z85038 Personal history of other malignant neoplasm of large intestine: Secondary | ICD-10-CM | POA: Insufficient documentation

## 2024-01-02 DIAGNOSIS — Z800 Family history of malignant neoplasm of digestive organs: Secondary | ICD-10-CM | POA: Insufficient documentation

## 2024-01-02 DIAGNOSIS — C772 Secondary and unspecified malignant neoplasm of intra-abdominal lymph nodes: Secondary | ICD-10-CM | POA: Diagnosis not present

## 2024-01-02 DIAGNOSIS — D500 Iron deficiency anemia secondary to blood loss (chronic): Secondary | ICD-10-CM | POA: Diagnosis not present

## 2024-01-02 DIAGNOSIS — Z8042 Family history of malignant neoplasm of prostate: Secondary | ICD-10-CM | POA: Insufficient documentation

## 2024-01-02 DIAGNOSIS — D539 Nutritional anemia, unspecified: Secondary | ICD-10-CM | POA: Insufficient documentation

## 2024-01-02 DIAGNOSIS — Z9221 Personal history of antineoplastic chemotherapy: Secondary | ICD-10-CM | POA: Insufficient documentation

## 2024-01-02 LAB — CMP (CANCER CENTER ONLY)
ALT: 22 U/L (ref 0–44)
AST: 52 U/L — ABNORMAL HIGH (ref 15–41)
Albumin: 3.9 g/dL (ref 3.5–5.0)
Alkaline Phosphatase: 74 U/L (ref 38–126)
Anion gap: 15 (ref 5–15)
BUN: 20 mg/dL (ref 8–23)
CO2: 19 mmol/L — ABNORMAL LOW (ref 22–32)
Calcium: 9.4 mg/dL (ref 8.9–10.3)
Chloride: 108 mmol/L (ref 98–111)
Creatinine: 1.59 mg/dL — ABNORMAL HIGH (ref 0.44–1.00)
GFR, Estimated: 32 mL/min — ABNORMAL LOW (ref >60)
Glucose, Bld: 142 mg/dL — ABNORMAL HIGH (ref 70–99)
Potassium: 4 mmol/L (ref 3.5–5.1)
Sodium: 142 mmol/L (ref 135–145)
Total Bilirubin: 0.4 mg/dL (ref 0.0–1.2)
Total Protein: 6.9 g/dL (ref 6.5–8.1)

## 2024-01-02 LAB — CBC WITH DIFFERENTIAL (CANCER CENTER ONLY)
Abs Immature Granulocytes: 0.03 K/uL (ref 0.00–0.07)
Basophils Absolute: 0.1 K/uL (ref 0.0–0.1)
Basophils Relative: 1 %
Eosinophils Absolute: 0 K/uL (ref 0.0–0.5)
Eosinophils Relative: 0 %
HCT: 29.8 % — ABNORMAL LOW (ref 36.0–46.0)
Hemoglobin: 9.4 g/dL — ABNORMAL LOW (ref 12.0–15.0)
Immature Granulocytes: 0 %
Lymphocytes Relative: 7 %
Lymphs Abs: 0.5 K/uL — ABNORMAL LOW (ref 0.7–4.0)
MCH: 28.8 pg (ref 26.0–34.0)
MCHC: 31.5 g/dL (ref 30.0–36.0)
MCV: 91.4 fL (ref 80.0–100.0)
Monocytes Absolute: 0.1 K/uL (ref 0.1–1.0)
Monocytes Relative: 2 %
Neutro Abs: 6.1 K/uL (ref 1.7–7.7)
Neutrophils Relative %: 90 %
Platelet Count: 329 K/uL (ref 150–400)
RBC: 3.26 MIL/uL — ABNORMAL LOW (ref 3.87–5.11)
RDW: 16 % — ABNORMAL HIGH (ref 11.5–15.5)
WBC Count: 6.8 K/uL (ref 4.0–10.5)
nRBC: 0 % (ref 0.0–0.2)

## 2024-01-02 LAB — CEA (ACCESS): CEA (CHCC): 8.78 ng/mL — ABNORMAL HIGH (ref 0.00–5.00)

## 2024-01-02 NOTE — Progress Notes (Signed)
 [facility]  [address] 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  She underwent surgical resection. She completed her 6 months of capecitibine by the end of 2023. She has no evidence of disease.   Iron deficiency anemia due to chronic blood loss Iron studies from July, 2024 were normal and her hemoglobin had come up from 10.3 to 11.6. It has now dropped to 10.9 so I will recheck her vitamin levels.   Macrocytic anemia Her anemia was macrocytic. Her hemoglobin was slowly dropping, down to 10.6, then went up to 11.3 but dropped to 10.3 in April,2024. Folate was normal but B12 was quite low at 166 so she was placed on B12 injections monthly. She does tell me her mother had to have B12 injections.  She notes that she feels better on the B-12 injections.  A repeat level in April was 506 despite missing an injection, so we stopped the B12 injections and repeat level was still 445. I will recheck it today.   Plan: She had a colonoscopy done on 07/25/2023 which revealed 2 diminutive colon polyps, pan diverticular disease, internal and external hemorrhoids, and anastomosis at 8 cm. Pathology on 07/27/2023 revealed 2 tubular adenomas with no dysplastic changes. She will have her annual mammogram in August. She has a hemoglobin of 9.4 today. Her CEA today is pending and I will call her with the results. I will see her back in 4 months with CBC, CMP, and CEA. The patient understands the plans discussed today and is in agreement with them.  She knows to contact our office if she develops concerns prior to her next appointment.  I provided 20 minutes of face-to-face time during this encounter and > 50% was spent counseling as documented under my assessment and plan.    Jenna Bach, FNP- [facility] DE[facility]  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)  08/11/2021 Cancer Staging   Staging form: Colon and Rectum, AJCC 8th Edition - Clinical stage from 08/11/2021: Stage IIIC (cT3, cN2b, cM0) - Signed by Cornelius Wanda [facility], MD on 09/14/2021 Histopathologic type: Adenocarcinoma, NOS Stage prefix: Initial diagnosis Total positive nodes: 12 Total nodes examined: 32 Histologic grade (G): G3 Histologic grading system: 4 grade system Laterality: Left Tumor size (mm): 63 Lymph-vascular invasion (LVI): LVI present/identified, NOS Diagnostic confirmation: Positive histology Specimen type: Excision Staged by: Managing physician Tumor deposits (TD): Absent Carcinoembryonic antigen (CEA) (ng/mL): 3.6 Perineural invasion (PNI): Unknown Microsatellite instability (MSI): Stable KRAS mutation: Not assessed NRAS mutation: Not assessed BRAF mutation: Not assessed Stage used in treatment planning: Yes National guidelines used in treatment planning: Yes Type of national guideline used in treatment planning:  NCCN Staging comments: Over 52 years old, will use capecitabine  at reduced dose as tolerated   08/25/2021 Initial Diagnosis   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)   09/21/2021 - 09/21/2021 Chemotherapy   Patient is on Treatment Plan : COLORECTAL  Capecitabine  q21d      INTERVAL HISTORY:  Jenna Hutchinson is here today for repeat clinical assessment for her stage IIIC colon cancer. Patient states that she feels well but complains of worsening balance and has started physical therapy recently. She had a colonoscopy done on 07/25/2023 which revealed 2 diminutive colon polyps, pan diverticular disease, internal and external hemorrhoids, and anastomosis at 8 cm. Pathology on 07/27/2023 revealed 2 tubular adenomas with no dysplastic changes. She will have her annual mammogram in August. She has a WBC of 5.3, low hemoglobin of 10.9 down from 11.3, and platelet count of 185,000. I will add a B-12, folate, ferritin, iron and TIBC to her labs today. Her CMP is unremarkable except for low total protein, and her renal function is improved.  Her CEA today is pending and I will call her with the results. I will see her back in 4 months with CBC, CMP, and CEA.  She denies fever, chills, night sweats, or other signs of infection. She denies cardiorespiratory and gastrointestinal issues. She  denies pain. Her appetite is good and her weight has increased 4 pounds over last 3.5 months.     REVIEW OF SYSTEMS:  Review of Systems  Constitutional: Negative.  Negative for appetite change, chills, diaphoresis, fatigue, fever and unexpected weight change.  HENT:  Negative.  Negative for hearing loss, lump/mass, mouth sores, nosebleeds, sore throat, tinnitus, trouble swallowing and voice change.   Eyes: Negative.  Negative for eye problems and icterus.  Respiratory: Negative.  Negative for chest tightness, cough, hemoptysis, shortness of breath and wheezing.   Cardiovascular:  Positive for leg swelling (feet). Negative for chest pain and palpitations.  Gastrointestinal: Negative.  Negative for abdominal distention, abdominal pain, blood in stool, constipation, diarrhea, nausea, rectal pain and vomiting.  Endocrine: Negative.  Negative for hot flashes.  Genitourinary: Negative.   Negative for bladder incontinence, difficulty urinating, dyspareunia, dysuria, frequency, hematuria, menstrual problem, nocturia, pelvic pain, vaginal bleeding and vaginal discharge.   Musculoskeletal:  Positive for gait problem (imbalance). Negative for arthralgias, back pain, flank pain, myalgias, neck pain and neck stiffness.  Skin: Negative.  Negative for itching, rash and wound.  Neurological:  Positive for gait problem (imbalance). Negative for dizziness, extremity weakness, headaches, light-headedness, numbness, seizures and speech difficulty.  Hematological: Negative.  Negative for adenopathy. Does not bruise/bleed easily.  Psychiatric/Behavioral: Negative.  Negative for confusion, decreased concentration, depression, sleep disturbance and suicidal ideas. The patient is not nervous/anxious.   All other systems reviewed and are negative.    VITALS:  Blood pressure 125/61, pulse 74, temperature 97.6 F (36.4 C), temperature source Oral, resp. rate 18, height 5' 1.7 (1.567 m), weight 112 lb 8 oz (51 kg), SpO2 100%.  Wt Readings from Last 3 Encounters:  01/02/24 112 lb 8 oz (51 kg)  08/07/23 123 lb 4.8 oz (55.9 kg)  04/25/23 119 lb 11.2 oz (54.3 kg)    Body mass index is 20.78 kg/m.  Performance status (ECOG): 1 - Symptomatic but completely ambulatory  PHYSICAL EXAM:  Physical Exam Vitals and nursing note reviewed.  Constitutional:      General: She is not in acute distress.    Appearance: Normal appearance. She is normal weight. She is  not ill-appearing, toxic-appearing or diaphoretic.  HENT:     Head: Normocephalic and atraumatic.     Right Ear: Tympanic membrane, ear canal and external ear normal. There is no impacted cerumen.     Left Ear: Tympanic membrane, ear canal and external ear normal. There is no impacted cerumen.     Nose: Nose normal. No congestion or rhinorrhea.     Mouth/Throat:     Mouth: Mucous membranes are moist.     Pharynx: Oropharynx is clear. No  oropharyngeal exudate or posterior oropharyngeal erythema.  Eyes:     General: No scleral icterus.       Right eye: No discharge.        Left eye: No discharge.     Extraocular Movements: Extraocular movements intact.     Conjunctiva/sclera: Conjunctivae normal.     Pupils: Pupils are equal, round, and reactive to light.  Neck:     Vascular: No carotid bruit.  Cardiovascular:     Rate and Rhythm: Regular rhythm. Bradycardia present.     Pulses: Normal pulses.     Heart sounds: Normal heart sounds. No murmur heard.    No friction rub. No gallop.  Pulmonary:     Effort: Pulmonary effort is normal. No respiratory distress.     Breath sounds: Normal breath sounds. No stridor. No wheezing, rhonchi or rales.  Chest:     Chest wall: No tenderness.  Abdominal:     General: Bowel sounds are normal. There is no distension.     Palpations: Abdomen is soft. There is no hepatomegaly, splenomegaly or mass.     Tenderness: There is no abdominal tenderness. There is no right CVA tenderness, left CVA tenderness, guarding or rebound.     Hernia: No hernia is present.  Musculoskeletal:        General: No swelling, tenderness, deformity or signs of injury. Normal range of motion.     Cervical back: Normal range of motion and neck supple. No rigidity or tenderness.     Right lower leg: No edema.     Left lower leg: No edema.     Right foot: Swelling present.     Left foot: Swelling present.  Lymphadenopathy:     Cervical: No cervical adenopathy.     Upper Body:     Right upper body: No supraclavicular or axillary adenopathy.     Left upper body: No supraclavicular or axillary adenopathy.     Lower Body: No right inguinal adenopathy. No left inguinal adenopathy.  Skin:    General: Skin is warm and dry.     Coloration: Skin is not jaundiced or pale.     Findings: No bruising, ecchymosis, erythema, lesion or rash.  Neurological:     General: No focal deficit present.     Mental Status: She is  alert and oriented to person, place, and time. Mental status is at baseline.     Cranial Nerves: No cranial nerve deficit.     Sensory: No sensory deficit.     Motor: No weakness.     Coordination: Coordination normal.     Gait: Gait normal.     Deep Tendon Reflexes: Reflexes normal.  Psychiatric:        Mood and Affect: Mood normal.        Behavior: Behavior normal.        Thought Content: Thought content normal.        Judgment: Judgment normal.    LABS:  Latest Ref Rng & Units 01/02/2024    3:37 PM 08/07/2023    1:19 PM 04/25/2023   12:47 PM  CBC  WBC 4.0 - 10.5 K/uL 6.8  5.3  5.5   Hemoglobin 12.0 - 15.0 g/dL 9.4  89.0  88.6   Hematocrit 36.0 - 46.0 % 29.8  32.9  33.9   Platelets 150 - 400 K/uL 329  185  229       Latest Ref Rng & Units 01/02/2024    3:37 PM 08/07/2023    1:19 PM 04/25/2023   12:47 PM  CMP  Glucose 70 - 99 mg/dL 857  890  86   BUN 8 - 23 mg/dL 20  22  26    Creatinine 0.44 - 1.00 mg/dL 8.40  8.82  8.59   Sodium 135 - 145 mmol/L 142  143  142   Potassium 3.5 - 5.1 mmol/L 4.0  4.2  4.1   Chloride 98 - 111 mmol/L 108  110  105   CO2 22 - 32 mmol/L 19  23  25    Calcium 8.9 - 10.3 mg/dL 9.4  9.3  9.4   Total Protein 6.5 - 8.1 g/dL 6.9  5.9  6.5   Total Bilirubin 0.0 - 1.2 mg/dL 0.4  0.5  0.5   Alkaline Phos 38 - 126 U/L 74  64  69   AST 15 - 41 U/L 52  51  54   ALT 0 - 44 U/L 22  30  34    Lab Results  Component Value Date   CEA1 1.7 01/19/2023   CEA 8.78 (H) 01/02/2024   /  CEA  Date Value Ref Range Status  01/19/2023 1.7 0.0 - 4.7 ng/mL Final    Comment:    (NOTE)                             Nonsmokers          <3.9                             Smokers             <5.6 Roche [employer] Electrochemiluminescence Immunoassay (ECLIA) Values obtained with different assay methods or kits cannot be used interchangeably.  Results cannot be interpreted as absolute evidence of the presence or absence of malignant disease. Performed At: [facility] [address] [place], [place] [id number] Jennette Shorter MD Ey:[id number]    CEA ([facility])  Date Value Ref Range Status  01/02/2024 8.78 (H) 0.00 - 5.00 ng/mL Final    Comment:    (NOTE) This test was performed using [employer] Coul[employer]'s paramagnetic chemiluminescent immunoassay. Values obtained from different assay methods cannot be used interchangeably. Please note that up to 8% of patients who smoke may see values 5.1-10.0 ng/ml and 1% of patients who smoke may see CEA levels >10.0 ng/ml. Performed at [facility], [address], [place], [place] [address]    No results found for: PSA1 No results found for: CAN199 No results found for: CAN125  No results found for: STEPHANY CARLOTA BENSON MARKEL EARLA JOANNIE DOC, MSPIKE, SPEI Lab Results  Component Value Date   TIBC 276 08/07/2023   TIBC 322 04/25/2023   TIBC 311 10/17/2022   FERRITIN 49 08/07/2023   FERRITIN 82 04/25/2023   FERRITIN 53 10/17/2022   IRONPCTSAT 19 08/07/2023   IRONPCTSAT 29  04/25/2023   IRONPCTSAT 20 10/17/2022   Lab Results  Component Value Date   VITAMINB12 262 08/07/2023   No results found for: LDH  STUDIES:  Exam: 07/25/2023 Colonoscopy with Polypectomy Impression: Diminutive colon polyps (2) Pan diverticular disease Internal external hemorrhoids Anastomosis at 8cm, unremarkable  Pathology: 07/27/2023 Diagnosis: Colon, polyps, ascending Tubular adenoma, fragmented No high grade dysplasia or malignancy identified Colon, polyps, transverse Tubular adenoma No high grade dysplasia or malignancy identified    HISTORY:   Past Medical History:  Diagnosis Date   Arthritis    B12 deficiency anemia 01/26/2022   HTN (hypertension)    Hypothyroidism    Osteoporosis     Past Surgical History:  Procedure Laterality Date   BLADDER SURGERY     COLON SURGERY     eyelid surgery      Family History  Problem Relation Age of  Onset   Colon cancer Mother    Prostate cancer Brother     Social History:  reports that she has never smoked. She has never used smokeless tobacco. She reports that she does not drink alcohol and does not use drugs.The patient is alone today.  Allergies: No Known Allergies  Current Medications: Current Outpatient Medications  Medication Sig Dispense Refill   clopidogrel (PLAVIX) 75 MG tablet Take 75 mg by mouth daily.     aspirin 81 MG chewable tablet Chew by mouth.     carvedilol (COREG) 3.125 MG tablet Take 3.125 mg by mouth 2 (two) times daily.     Cholecalciferol 25 MCG (1000 UT) capsule Take by mouth.     cyanocobalamin  (VITAMIN B12) 1000 MCG tablet Take 1,000 mcg by mouth daily.     fluticasone (FLONASE) 50 MCG/ACT nasal spray Place 2 sprays into both nostrils daily.     furosemide (LASIX) 20 MG tablet Take 20 mg by mouth daily.     hydroxychloroquine (PLAQUENIL) 200 MG tablet Take 200 mg by mouth 2 (two) times daily.     nitroGLYCERIN (NITROSTAT) 0.4 MG SL tablet Place under the tongue.     predniSONE (DELTASONE) 10 MG tablet Take 10 mg by mouth daily as needed.     PROLIA  60 MG/ML SOSY injection Inject into the skin.     rosuvastatin (CRESTOR) 20 MG tablet Take 20 mg by mouth at bedtime.     SYNTHROID 112 MCG tablet Take 112 mcg by mouth daily.     No current facility-administered medications for this visit.

## 2024-01-03 ENCOUNTER — Telehealth: Payer: Self-pay | Admitting: Oncology

## 2024-01-03 NOTE — Telephone Encounter (Signed)
 Patient has been scheduled for follow-up visit per 01/02/24 LOS.  LVM notifying pt of appt details, provided my direct number to pt if appt changes need to be made.

## 2024-01-07 DIAGNOSIS — N390 Urinary tract infection, site not specified: Secondary | ICD-10-CM | POA: Diagnosis not present

## 2024-01-07 DIAGNOSIS — R600 Localized edema: Secondary | ICD-10-CM | POA: Diagnosis not present

## 2024-01-07 DIAGNOSIS — R531 Weakness: Secondary | ICD-10-CM | POA: Diagnosis not present

## 2024-01-07 DIAGNOSIS — M359 Systemic involvement of connective tissue, unspecified: Secondary | ICD-10-CM | POA: Diagnosis not present

## 2024-01-07 DIAGNOSIS — Z681 Body mass index (BMI) 19 or less, adult: Secondary | ICD-10-CM | POA: Diagnosis not present

## 2024-01-18 DIAGNOSIS — I252 Old myocardial infarction: Secondary | ICD-10-CM | POA: Diagnosis not present

## 2024-01-18 DIAGNOSIS — R059 Cough, unspecified: Secondary | ICD-10-CM | POA: Diagnosis not present

## 2024-01-18 DIAGNOSIS — E860 Dehydration: Secondary | ICD-10-CM | POA: Diagnosis not present

## 2024-01-18 DIAGNOSIS — I509 Heart failure, unspecified: Secondary | ICD-10-CM | POA: Diagnosis not present

## 2024-01-18 DIAGNOSIS — Z955 Presence of coronary angioplasty implant and graft: Secondary | ICD-10-CM | POA: Diagnosis not present

## 2024-01-18 DIAGNOSIS — Z85038 Personal history of other malignant neoplasm of large intestine: Secondary | ICD-10-CM | POA: Diagnosis not present

## 2024-01-18 DIAGNOSIS — I456 Pre-excitation syndrome: Secondary | ICD-10-CM | POA: Diagnosis not present

## 2024-01-18 DIAGNOSIS — R778 Other specified abnormalities of plasma proteins: Secondary | ICD-10-CM | POA: Diagnosis not present

## 2024-01-18 DIAGNOSIS — R1111 Vomiting without nausea: Secondary | ICD-10-CM | POA: Diagnosis not present

## 2024-01-18 DIAGNOSIS — N1832 Chronic kidney disease, stage 3b: Secondary | ICD-10-CM | POA: Diagnosis not present

## 2024-01-18 DIAGNOSIS — B9789 Other viral agents as the cause of diseases classified elsewhere: Secondary | ICD-10-CM | POA: Diagnosis not present

## 2024-01-18 DIAGNOSIS — I130 Hypertensive heart and chronic kidney disease with heart failure and stage 1 through stage 4 chronic kidney disease, or unspecified chronic kidney disease: Secondary | ICD-10-CM | POA: Diagnosis not present

## 2024-01-18 DIAGNOSIS — E785 Hyperlipidemia, unspecified: Secondary | ICD-10-CM | POA: Diagnosis not present

## 2024-01-18 DIAGNOSIS — I4510 Unspecified right bundle-branch block: Secondary | ICD-10-CM | POA: Diagnosis not present

## 2024-01-18 DIAGNOSIS — A4159 Other Gram-negative sepsis: Secondary | ICD-10-CM | POA: Diagnosis not present

## 2024-01-18 DIAGNOSIS — Z87440 Personal history of urinary (tract) infections: Secondary | ICD-10-CM | POA: Diagnosis not present

## 2024-01-18 DIAGNOSIS — R531 Weakness: Secondary | ICD-10-CM | POA: Diagnosis not present

## 2024-01-18 DIAGNOSIS — I2510 Atherosclerotic heart disease of native coronary artery without angina pectoris: Secondary | ICD-10-CM | POA: Diagnosis not present

## 2024-01-18 DIAGNOSIS — R110 Nausea: Secondary | ICD-10-CM | POA: Diagnosis not present

## 2024-01-18 DIAGNOSIS — R509 Fever, unspecified: Secondary | ICD-10-CM | POA: Diagnosis not present

## 2024-01-18 DIAGNOSIS — N136 Pyonephrosis: Secondary | ICD-10-CM | POA: Diagnosis not present

## 2024-01-18 DIAGNOSIS — N179 Acute kidney failure, unspecified: Secondary | ICD-10-CM | POA: Diagnosis not present

## 2024-01-18 DIAGNOSIS — R197 Diarrhea, unspecified: Secondary | ICD-10-CM | POA: Diagnosis not present

## 2024-01-18 DIAGNOSIS — R6520 Severe sepsis without septic shock: Secondary | ICD-10-CM | POA: Diagnosis not present

## 2024-01-18 DIAGNOSIS — M1990 Unspecified osteoarthritis, unspecified site: Secondary | ICD-10-CM | POA: Diagnosis not present

## 2024-01-18 DIAGNOSIS — E876 Hypokalemia: Secondary | ICD-10-CM | POA: Diagnosis not present

## 2024-01-18 DIAGNOSIS — E872 Acidosis, unspecified: Secondary | ICD-10-CM | POA: Diagnosis not present

## 2024-01-18 DIAGNOSIS — E440 Moderate protein-calorie malnutrition: Secondary | ICD-10-CM | POA: Diagnosis not present

## 2024-01-18 DIAGNOSIS — Z681 Body mass index (BMI) 19 or less, adult: Secondary | ICD-10-CM | POA: Diagnosis not present

## 2024-01-18 DIAGNOSIS — E039 Hypothyroidism, unspecified: Secondary | ICD-10-CM | POA: Diagnosis not present

## 2024-01-18 DIAGNOSIS — R0602 Shortness of breath: Secondary | ICD-10-CM | POA: Diagnosis not present

## 2024-01-18 DIAGNOSIS — B961 Klebsiella pneumoniae [K. pneumoniae] as the cause of diseases classified elsewhere: Secondary | ICD-10-CM | POA: Diagnosis not present

## 2024-01-18 DIAGNOSIS — R5381 Other malaise: Secondary | ICD-10-CM | POA: Diagnosis not present

## 2024-01-29 DIAGNOSIS — D539 Nutritional anemia, unspecified: Secondary | ICD-10-CM | POA: Diagnosis not present

## 2024-01-29 DIAGNOSIS — E559 Vitamin D deficiency, unspecified: Secondary | ICD-10-CM | POA: Diagnosis not present

## 2024-01-29 DIAGNOSIS — E785 Hyperlipidemia, unspecified: Secondary | ICD-10-CM | POA: Diagnosis not present

## 2024-01-29 DIAGNOSIS — N1330 Unspecified hydronephrosis: Secondary | ICD-10-CM | POA: Diagnosis not present

## 2024-01-29 DIAGNOSIS — I10 Essential (primary) hypertension: Secondary | ICD-10-CM | POA: Diagnosis not present

## 2024-01-29 DIAGNOSIS — I2510 Atherosclerotic heart disease of native coronary artery without angina pectoris: Secondary | ICD-10-CM | POA: Diagnosis not present

## 2024-01-29 DIAGNOSIS — N390 Urinary tract infection, site not specified: Secondary | ICD-10-CM | POA: Diagnosis not present

## 2024-01-29 DIAGNOSIS — E039 Hypothyroidism, unspecified: Secondary | ICD-10-CM | POA: Diagnosis not present

## 2024-01-29 DIAGNOSIS — M359 Systemic involvement of connective tissue, unspecified: Secondary | ICD-10-CM | POA: Diagnosis not present

## 2024-01-29 DIAGNOSIS — M810 Age-related osteoporosis without current pathological fracture: Secondary | ICD-10-CM | POA: Diagnosis not present

## 2024-01-29 DIAGNOSIS — R600 Localized edema: Secondary | ICD-10-CM | POA: Diagnosis not present

## 2024-01-29 DIAGNOSIS — N1831 Chronic kidney disease, stage 3a: Secondary | ICD-10-CM | POA: Diagnosis not present

## 2024-02-05 ENCOUNTER — Encounter (HOSPITAL_BASED_OUTPATIENT_CLINIC_OR_DEPARTMENT_OTHER): Payer: Self-pay

## 2024-02-06 DIAGNOSIS — Z79899 Other long term (current) drug therapy: Secondary | ICD-10-CM | POA: Diagnosis not present

## 2024-02-14 DIAGNOSIS — Z681 Body mass index (BMI) 19 or less, adult: Secondary | ICD-10-CM | POA: Diagnosis not present

## 2024-02-14 DIAGNOSIS — R600 Localized edema: Secondary | ICD-10-CM | POA: Diagnosis not present

## 2024-02-14 DIAGNOSIS — M359 Systemic involvement of connective tissue, unspecified: Secondary | ICD-10-CM | POA: Diagnosis not present

## 2024-02-19 DIAGNOSIS — N390 Urinary tract infection, site not specified: Secondary | ICD-10-CM | POA: Diagnosis not present

## 2024-02-19 DIAGNOSIS — N1330 Unspecified hydronephrosis: Secondary | ICD-10-CM | POA: Diagnosis not present

## 2024-02-20 DIAGNOSIS — H16229 Keratoconjunctivitis sicca, not specified as Sjogren's, unspecified eye: Secondary | ICD-10-CM | POA: Diagnosis not present

## 2024-02-23 DIAGNOSIS — Z85038 Personal history of other malignant neoplasm of large intestine: Secondary | ICD-10-CM | POA: Diagnosis not present

## 2024-02-23 DIAGNOSIS — R6521 Severe sepsis with septic shock: Secondary | ICD-10-CM | POA: Diagnosis not present

## 2024-02-23 DIAGNOSIS — N179 Acute kidney failure, unspecified: Secondary | ICD-10-CM | POA: Diagnosis not present

## 2024-02-23 DIAGNOSIS — R591 Generalized enlarged lymph nodes: Secondary | ICD-10-CM | POA: Diagnosis not present

## 2024-02-23 DIAGNOSIS — N3001 Acute cystitis with hematuria: Secondary | ICD-10-CM | POA: Diagnosis not present

## 2024-02-23 DIAGNOSIS — M1990 Unspecified osteoarthritis, unspecified site: Secondary | ICD-10-CM | POA: Diagnosis not present

## 2024-02-23 DIAGNOSIS — N131 Hydronephrosis with ureteral stricture, not elsewhere classified: Secondary | ICD-10-CM | POA: Diagnosis not present

## 2024-02-23 DIAGNOSIS — N390 Urinary tract infection, site not specified: Secondary | ICD-10-CM | POA: Diagnosis not present

## 2024-02-23 DIAGNOSIS — R197 Diarrhea, unspecified: Secondary | ICD-10-CM | POA: Diagnosis not present

## 2024-02-23 DIAGNOSIS — E039 Hypothyroidism, unspecified: Secondary | ICD-10-CM | POA: Diagnosis not present

## 2024-02-23 DIAGNOSIS — R6520 Severe sepsis without septic shock: Secondary | ICD-10-CM | POA: Diagnosis not present

## 2024-02-23 DIAGNOSIS — B952 Enterococcus as the cause of diseases classified elsewhere: Secondary | ICD-10-CM | POA: Diagnosis not present

## 2024-02-23 DIAGNOSIS — J181 Lobar pneumonia, unspecified organism: Secondary | ICD-10-CM | POA: Diagnosis not present

## 2024-02-23 DIAGNOSIS — B961 Klebsiella pneumoniae [K. pneumoniae] as the cause of diseases classified elsewhere: Secondary | ICD-10-CM | POA: Diagnosis not present

## 2024-02-23 DIAGNOSIS — D696 Thrombocytopenia, unspecified: Secondary | ICD-10-CM | POA: Diagnosis not present

## 2024-02-23 DIAGNOSIS — Z87440 Personal history of urinary (tract) infections: Secondary | ICD-10-CM | POA: Diagnosis not present

## 2024-02-23 DIAGNOSIS — R109 Unspecified abdominal pain: Secondary | ICD-10-CM | POA: Diagnosis not present

## 2024-02-23 DIAGNOSIS — E43 Unspecified severe protein-calorie malnutrition: Secondary | ICD-10-CM | POA: Diagnosis not present

## 2024-02-23 DIAGNOSIS — N1330 Unspecified hydronephrosis: Secondary | ICD-10-CM | POA: Diagnosis not present

## 2024-02-23 DIAGNOSIS — I2510 Atherosclerotic heart disease of native coronary artery without angina pectoris: Secondary | ICD-10-CM | POA: Diagnosis not present

## 2024-02-23 DIAGNOSIS — E785 Hyperlipidemia, unspecified: Secondary | ICD-10-CM | POA: Diagnosis not present

## 2024-02-23 DIAGNOSIS — Z955 Presence of coronary angioplasty implant and graft: Secondary | ICD-10-CM | POA: Diagnosis not present

## 2024-02-23 DIAGNOSIS — I10 Essential (primary) hypertension: Secondary | ICD-10-CM | POA: Diagnosis not present

## 2024-02-23 DIAGNOSIS — E872 Acidosis, unspecified: Secondary | ICD-10-CM | POA: Diagnosis not present

## 2024-02-23 DIAGNOSIS — N3289 Other specified disorders of bladder: Secondary | ICD-10-CM | POA: Diagnosis not present

## 2024-02-23 DIAGNOSIS — I469 Cardiac arrest, cause unspecified: Secondary | ICD-10-CM | POA: Diagnosis not present

## 2024-02-23 DIAGNOSIS — C799 Secondary malignant neoplasm of unspecified site: Secondary | ICD-10-CM | POA: Diagnosis not present

## 2024-02-23 DIAGNOSIS — I252 Old myocardial infarction: Secondary | ICD-10-CM | POA: Diagnosis not present

## 2024-02-23 DIAGNOSIS — I959 Hypotension, unspecified: Secondary | ICD-10-CM | POA: Diagnosis not present

## 2024-02-23 DIAGNOSIS — R531 Weakness: Secondary | ICD-10-CM | POA: Diagnosis not present

## 2024-02-23 DIAGNOSIS — M549 Dorsalgia, unspecified: Secondary | ICD-10-CM | POA: Diagnosis not present

## 2024-02-23 DIAGNOSIS — J90 Pleural effusion, not elsewhere classified: Secondary | ICD-10-CM | POA: Diagnosis not present

## 2024-02-23 DIAGNOSIS — A419 Sepsis, unspecified organism: Secondary | ICD-10-CM | POA: Diagnosis not present

## 2024-02-24 DIAGNOSIS — R6520 Severe sepsis without septic shock: Secondary | ICD-10-CM | POA: Diagnosis not present

## 2024-02-24 DIAGNOSIS — A419 Sepsis, unspecified organism: Secondary | ICD-10-CM | POA: Diagnosis not present

## 2024-02-24 DIAGNOSIS — R591 Generalized enlarged lymph nodes: Secondary | ICD-10-CM | POA: Diagnosis not present

## 2024-03-03 DEATH — deceased

## 2024-05-07 ENCOUNTER — Ambulatory Visit: Admitting: Oncology

## 2024-05-07 ENCOUNTER — Other Ambulatory Visit
